# Patient Record
Sex: Female | Born: 1980
Health system: Southern US, Community
[De-identification: ages and names within clinical notes are randomized; demographics above are authoritative.]

## PROBLEM LIST (undated history)

## (undated) DIAGNOSIS — T4145XA Adverse effect of unspecified anesthetic, initial encounter: Secondary | ICD-10-CM

## (undated) DIAGNOSIS — T8859XA Other complications of anesthesia, initial encounter: Secondary | ICD-10-CM

## (undated) DIAGNOSIS — N63 Unspecified lump in unspecified breast: Secondary | ICD-10-CM

## (undated) DIAGNOSIS — G939 Disorder of brain, unspecified: Secondary | ICD-10-CM

## (undated) DIAGNOSIS — M797 Fibromyalgia: Secondary | ICD-10-CM

## (undated) DIAGNOSIS — R000 Tachycardia, unspecified: Secondary | ICD-10-CM

## (undated) DIAGNOSIS — G500 Trigeminal neuralgia: Secondary | ICD-10-CM

## (undated) DIAGNOSIS — E669 Obesity, unspecified: Secondary | ICD-10-CM

## (undated) DIAGNOSIS — F39 Unspecified mood [affective] disorder: Secondary | ICD-10-CM

## (undated) HISTORY — PX: ELBOW FRACTURE SURGERY: SHX616

## (undated) HISTORY — DX: Obesity, unspecified: E66.9

## (undated) HISTORY — PX: NASAL TURBINATE REDUCTION: SHX2072

## (undated) HISTORY — PX: BREAST EXCISIONAL BIOPSY: SUR124

---
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
## 2001-11-04 ENCOUNTER — Emergency Department (HOSPITAL_COMMUNITY): Admission: EM | Admit: 2001-11-04 | Discharge: 2001-11-04 | Payer: Self-pay | Admitting: Emergency Medicine

## 2001-12-28 ENCOUNTER — Other Ambulatory Visit: Admission: RE | Admit: 2001-12-28 | Discharge: 2001-12-28 | Payer: Self-pay | Admitting: *Deleted

## 2002-01-06 ENCOUNTER — Encounter: Payer: Self-pay | Admitting: *Deleted

## 2002-01-06 ENCOUNTER — Ambulatory Visit (HOSPITAL_COMMUNITY): Admission: AD | Admit: 2002-01-06 | Discharge: 2002-01-06 | Payer: Self-pay | Admitting: *Deleted

## 2002-01-06 ENCOUNTER — Encounter (INDEPENDENT_AMBULATORY_CARE_PROVIDER_SITE_OTHER): Payer: Self-pay

## 2002-01-06 HISTORY — PX: DILATION AND CURETTAGE OF UTERUS: SHX78

## 2002-01-11 ENCOUNTER — Inpatient Hospital Stay (HOSPITAL_COMMUNITY): Admission: AD | Admit: 2002-01-11 | Discharge: 2002-01-11 | Payer: Self-pay | Admitting: *Deleted

## 2002-01-18 ENCOUNTER — Inpatient Hospital Stay (HOSPITAL_COMMUNITY): Admission: AD | Admit: 2002-01-18 | Discharge: 2002-01-18 | Payer: Self-pay | Admitting: *Deleted

## 2002-01-18 ENCOUNTER — Encounter (INDEPENDENT_AMBULATORY_CARE_PROVIDER_SITE_OTHER): Payer: Self-pay

## 2002-04-27 ENCOUNTER — Emergency Department (HOSPITAL_COMMUNITY): Admission: EM | Admit: 2002-04-27 | Discharge: 2002-04-27 | Payer: Self-pay | Admitting: Emergency Medicine

## 2002-05-09 ENCOUNTER — Emergency Department (HOSPITAL_COMMUNITY): Admission: EM | Admit: 2002-05-09 | Discharge: 2002-05-09 | Payer: Self-pay | Admitting: Emergency Medicine

## 2003-07-20 ENCOUNTER — Inpatient Hospital Stay (HOSPITAL_COMMUNITY): Admission: AD | Admit: 2003-07-20 | Discharge: 2003-07-21 | Payer: Self-pay | Admitting: Obstetrics

## 2003-12-02 ENCOUNTER — Emergency Department (HOSPITAL_COMMUNITY): Admission: EM | Admit: 2003-12-02 | Discharge: 2003-12-03 | Payer: Self-pay | Admitting: Emergency Medicine

## 2004-01-09 ENCOUNTER — Inpatient Hospital Stay (HOSPITAL_COMMUNITY): Admission: AD | Admit: 2004-01-09 | Discharge: 2004-01-09 | Payer: Self-pay | Admitting: Obstetrics & Gynecology

## 2004-03-01 ENCOUNTER — Inpatient Hospital Stay (HOSPITAL_COMMUNITY): Admission: AD | Admit: 2004-03-01 | Discharge: 2004-03-01 | Payer: Self-pay | Admitting: Obstetrics and Gynecology

## 2004-03-05 ENCOUNTER — Inpatient Hospital Stay (HOSPITAL_COMMUNITY): Admission: AD | Admit: 2004-03-05 | Discharge: 2004-03-05 | Payer: Self-pay | Admitting: Obstetrics and Gynecology

## 2004-03-07 ENCOUNTER — Inpatient Hospital Stay (HOSPITAL_COMMUNITY): Admission: AD | Admit: 2004-03-07 | Discharge: 2004-03-08 | Payer: Self-pay | Admitting: Obstetrics & Gynecology

## 2004-03-08 ENCOUNTER — Inpatient Hospital Stay (HOSPITAL_COMMUNITY): Admission: AD | Admit: 2004-03-08 | Discharge: 2004-03-08 | Payer: Self-pay | Admitting: Obstetrics & Gynecology

## 2004-03-09 ENCOUNTER — Inpatient Hospital Stay (HOSPITAL_COMMUNITY): Admission: AD | Admit: 2004-03-09 | Discharge: 2004-03-12 | Payer: Self-pay | Admitting: Obstetrics & Gynecology

## 2004-08-04 ENCOUNTER — Emergency Department (HOSPITAL_COMMUNITY): Admission: EM | Admit: 2004-08-04 | Discharge: 2004-08-04 | Payer: Self-pay | Admitting: Emergency Medicine

## 2004-08-28 ENCOUNTER — Emergency Department (HOSPITAL_COMMUNITY): Admission: EM | Admit: 2004-08-28 | Discharge: 2004-08-28 | Payer: Self-pay | Admitting: Emergency Medicine

## 2004-11-11 ENCOUNTER — Emergency Department (HOSPITAL_COMMUNITY): Admission: EM | Admit: 2004-11-11 | Discharge: 2004-11-11 | Payer: Self-pay | Admitting: Emergency Medicine

## 2005-02-14 ENCOUNTER — Inpatient Hospital Stay (HOSPITAL_COMMUNITY): Admission: AD | Admit: 2005-02-14 | Discharge: 2005-02-14 | Payer: Self-pay | Admitting: Obstetrics & Gynecology

## 2005-02-19 ENCOUNTER — Inpatient Hospital Stay (HOSPITAL_COMMUNITY): Admission: AD | Admit: 2005-02-19 | Discharge: 2005-02-19 | Payer: Self-pay | Admitting: Obstetrics & Gynecology

## 2005-03-12 ENCOUNTER — Other Ambulatory Visit: Admission: RE | Admit: 2005-03-12 | Discharge: 2005-03-12 | Payer: Self-pay | Admitting: Obstetrics and Gynecology

## 2005-05-17 ENCOUNTER — Inpatient Hospital Stay (HOSPITAL_COMMUNITY): Admission: AD | Admit: 2005-05-17 | Discharge: 2005-05-17 | Payer: Self-pay | Admitting: Obstetrics and Gynecology

## 2005-07-18 ENCOUNTER — Inpatient Hospital Stay (HOSPITAL_COMMUNITY): Admission: AD | Admit: 2005-07-18 | Discharge: 2005-07-19 | Payer: Self-pay | Admitting: Obstetrics and Gynecology

## 2005-07-24 ENCOUNTER — Emergency Department (HOSPITAL_COMMUNITY): Admission: EM | Admit: 2005-07-24 | Discharge: 2005-07-24 | Payer: Self-pay | Admitting: Emergency Medicine

## 2005-09-18 ENCOUNTER — Inpatient Hospital Stay (HOSPITAL_COMMUNITY): Admission: AD | Admit: 2005-09-18 | Discharge: 2005-09-18 | Payer: Self-pay | Admitting: Obstetrics and Gynecology

## 2005-09-20 ENCOUNTER — Inpatient Hospital Stay (HOSPITAL_COMMUNITY): Admission: AD | Admit: 2005-09-20 | Discharge: 2005-09-20 | Payer: Self-pay | Admitting: Obstetrics and Gynecology

## 2005-09-30 ENCOUNTER — Ambulatory Visit (HOSPITAL_COMMUNITY): Admission: RE | Admit: 2005-09-30 | Discharge: 2005-09-30 | Payer: Self-pay | Admitting: Obstetrics and Gynecology

## 2005-10-07 ENCOUNTER — Inpatient Hospital Stay (HOSPITAL_COMMUNITY): Admission: AD | Admit: 2005-10-07 | Discharge: 2005-10-07 | Payer: Self-pay | Admitting: Obstetrics and Gynecology

## 2005-10-07 ENCOUNTER — Inpatient Hospital Stay (HOSPITAL_COMMUNITY): Admission: AD | Admit: 2005-10-07 | Discharge: 2005-10-10 | Payer: Self-pay | Admitting: Obstetrics and Gynecology

## 2005-10-12 ENCOUNTER — Ambulatory Visit: Admission: RE | Admit: 2005-10-12 | Discharge: 2005-10-12 | Payer: Self-pay | Admitting: Obstetrics and Gynecology

## 2006-07-02 ENCOUNTER — Other Ambulatory Visit: Admission: RE | Admit: 2006-07-02 | Discharge: 2006-07-02 | Payer: Self-pay | Admitting: Family Medicine

## 2007-05-23 ENCOUNTER — Inpatient Hospital Stay (HOSPITAL_COMMUNITY): Admission: AD | Admit: 2007-05-23 | Discharge: 2007-05-23 | Payer: Self-pay | Admitting: Obstetrics and Gynecology

## 2007-05-30 ENCOUNTER — Inpatient Hospital Stay (HOSPITAL_COMMUNITY): Admission: AD | Admit: 2007-05-30 | Discharge: 2007-05-31 | Payer: Self-pay | Admitting: Obstetrics and Gynecology

## 2007-07-31 ENCOUNTER — Emergency Department (HOSPITAL_COMMUNITY): Admission: EM | Admit: 2007-07-31 | Discharge: 2007-07-31 | Payer: Self-pay | Admitting: Emergency Medicine

## 2007-09-06 ENCOUNTER — Ambulatory Visit (HOSPITAL_COMMUNITY): Admission: RE | Admit: 2007-09-06 | Discharge: 2007-09-06 | Payer: Self-pay | Admitting: Obstetrics and Gynecology

## 2007-11-23 ENCOUNTER — Inpatient Hospital Stay (HOSPITAL_COMMUNITY): Admission: AD | Admit: 2007-11-23 | Discharge: 2007-11-23 | Payer: Self-pay | Admitting: Obstetrics and Gynecology

## 2007-12-07 ENCOUNTER — Inpatient Hospital Stay (HOSPITAL_COMMUNITY): Admission: AD | Admit: 2007-12-07 | Discharge: 2007-12-07 | Payer: Self-pay | Admitting: Obstetrics and Gynecology

## 2007-12-20 ENCOUNTER — Inpatient Hospital Stay (HOSPITAL_COMMUNITY): Admission: RE | Admit: 2007-12-20 | Discharge: 2007-12-20 | Payer: Self-pay | Admitting: Obstetrics and Gynecology

## 2007-12-22 ENCOUNTER — Inpatient Hospital Stay (HOSPITAL_COMMUNITY): Admission: RE | Admit: 2007-12-22 | Discharge: 2007-12-24 | Payer: Self-pay | Admitting: Obstetrics and Gynecology

## 2008-05-04 HISTORY — PX: UMBILICAL HERNIA REPAIR: SHX196

## 2010-03-09 ENCOUNTER — Emergency Department (HOSPITAL_COMMUNITY): Admission: EM | Admit: 2010-03-09 | Discharge: 2010-03-09 | Payer: Self-pay | Admitting: Emergency Medicine

## 2010-03-17 ENCOUNTER — Emergency Department (HOSPITAL_BASED_OUTPATIENT_CLINIC_OR_DEPARTMENT_OTHER): Admission: EM | Admit: 2010-03-17 | Discharge: 2010-03-17 | Payer: Self-pay | Admitting: Emergency Medicine

## 2010-03-17 ENCOUNTER — Ambulatory Visit: Payer: Self-pay | Admitting: Diagnostic Radiology

## 2010-07-15 LAB — COMPREHENSIVE METABOLIC PANEL
ALT: 17 U/L (ref 0–35)
AST: 16 U/L (ref 0–37)
Albumin: 4.3 g/dL (ref 3.5–5.2)
Alkaline Phosphatase: 85 U/L (ref 39–117)
BUN: 14 mg/dL (ref 6–23)
CO2: 26 mEq/L (ref 19–32)
Calcium: 9 mg/dL (ref 8.4–10.5)
Chloride: 107 mEq/L (ref 96–112)
Creatinine, Ser: 0.8 mg/dL (ref 0.4–1.2)
GFR calc Af Amer: >60 mL/min (ref >60)
GFR calc non Af Amer: >60 mL/min (ref >60)
Glucose, Bld: 86 mg/dL (ref 70–99)
Potassium: 4.2 mEq/L (ref 3.5–5.1)
Sodium: 145 mEq/L (ref 135–145)
Total Bilirubin: 0.7 mg/dL (ref 0.3–1.2)
Total Protein: 7.3 g/dL (ref 6.0–8.3)

## 2010-07-15 LAB — GC/CHLAMYDIA PROBE AMP, GENITAL
Chlamydia, DNA Probe: NEGATIVE
GC Probe Amp, Genital: NEGATIVE

## 2010-07-15 LAB — DIFFERENTIAL
Basophils Absolute: 0 10*3/uL (ref 0.0–0.1)
Basophils Relative: 1 % (ref 0–1)
Eosinophils Absolute: 0.3 10*3/uL (ref 0.0–0.7)
Eosinophils Relative: 3 % (ref 0–5)
Lymphocytes Relative: 23 % (ref 12–46)
Lymphs Abs: 2.1 10*3/uL (ref 0.7–4.0)
Monocytes Absolute: 0.6 10*3/uL (ref 0.1–1.0)
Monocytes Relative: 7 % (ref 3–12)
Neutro Abs: 5.9 10*3/uL (ref 1.7–7.7)
Neutrophils Relative %: 66 % (ref 43–77)

## 2010-07-15 LAB — WET PREP, GENITAL
Trich, Wet Prep: NONE SEEN
Yeast Wet Prep HPF POC: NONE SEEN

## 2010-07-15 LAB — POCT URINALYSIS DIPSTICK
Bilirubin Urine: NEGATIVE
Glucose, UA: NEGATIVE mg/dL
Hgb urine dipstick: NEGATIVE
Ketones, ur: NEGATIVE mg/dL
Nitrite: NEGATIVE
Protein, ur: NEGATIVE mg/dL
Specific Gravity, Urine: 1.02 (ref 1.005–1.030)
Urobilinogen, UA: 1 mg/dL (ref 0.0–1.0)
pH: 6.5 (ref 5.0–8.0)

## 2010-07-15 LAB — PREGNANCY, URINE: Preg Test, Ur: NEGATIVE

## 2010-07-15 LAB — URINALYSIS, ROUTINE W REFLEX MICROSCOPIC
Bilirubin Urine: NEGATIVE
Glucose, UA: NEGATIVE mg/dL
Hgb urine dipstick: NEGATIVE
Ketones, ur: NEGATIVE mg/dL
Nitrite: NEGATIVE
Protein, ur: NEGATIVE mg/dL
Specific Gravity, Urine: 1.022 (ref 1.005–1.030)
Urobilinogen, UA: 1 mg/dL (ref 0.0–1.0)
pH: 8 (ref 5.0–8.0)

## 2010-07-15 LAB — CBC
HCT: 38.4 % (ref 36.0–46.0)
Hemoglobin: 13.5 g/dL (ref 12.0–15.0)
MCH: 31.4 pg (ref 26.0–34.0)
MCHC: 35.1 g/dL (ref 30.0–36.0)
MCV: 89.3 fL (ref 78.0–100.0)
Platelets: 239 10*3/uL (ref 150–400)
RBC: 4.3 MIL/uL (ref 3.87–5.11)
RDW: 11.5 % (ref 11.5–15.5)
WBC: 8.9 10*3/uL (ref 4.0–10.5)

## 2010-07-15 LAB — LIPASE, BLOOD: Lipase: 85 U/L (ref 23–300)

## 2010-07-15 LAB — POCT PREGNANCY, URINE: Preg Test, Ur: NEGATIVE

## 2010-07-31 ENCOUNTER — Encounter (HOSPITAL_COMMUNITY)
Admission: RE | Admit: 2010-07-31 | Discharge: 2010-07-31 | Disposition: A | Discharge status: Home / Self Care | Payer: Commercial Managed Care - PPO | Source: Ambulatory Visit | Attending: General Surgery | Admitting: General Surgery

## 2010-07-31 LAB — CBC
HCT: 41.4 % (ref 36.0–46.0)
Hemoglobin: 14.2 g/dL (ref 12.0–15.0)
MCH: 30.7 pg (ref 26.0–34.0)
MCHC: 34.3 g/dL (ref 30.0–36.0)
MCV: 89.4 fL (ref 78.0–100.0)
Platelets: 264 10*3/uL (ref 150–400)
RBC: 4.63 MIL/uL (ref 3.87–5.11)

## 2010-07-31 LAB — COMPREHENSIVE METABOLIC PANEL
ALT: 21 U/L (ref 0–35)
AST: 17 U/L (ref 0–37)
Albumin: 4.4 g/dL (ref 3.5–5.2)
Alkaline Phosphatase: 72 U/L (ref 39–117)
BUN: 8 mg/dL (ref 6–23)
CO2: 29 mEq/L (ref 19–32)
Calcium: 9.4 mg/dL (ref 8.4–10.5)
Chloride: 101 mEq/L (ref 96–112)
GFR calc Af Amer: >60 mL/min (ref >60)
GFR calc non Af Amer: >60 mL/min (ref >60)
Glucose, Bld: 90 mg/dL (ref 70–99)
Potassium: 4.7 mEq/L (ref 3.5–5.1)
Total Bilirubin: 0.9 mg/dL (ref 0.3–1.2)

## 2010-07-31 LAB — HCG, SERUM, QUALITATIVE: Preg, Serum: NEGATIVE

## 2010-08-01 ENCOUNTER — Observation Stay (HOSPITAL_COMMUNITY)
Admission: RE | Admit: 2010-08-01 | Discharge: 2010-08-02 | Disposition: A | Discharge status: Home / Self Care | Payer: Commercial Managed Care - PPO | Source: Ambulatory Visit | Attending: General Surgery | Admitting: General Surgery

## 2010-08-01 ENCOUNTER — Other Ambulatory Visit: Payer: Self-pay | Admitting: General Surgery

## 2010-08-01 DIAGNOSIS — F3289 Other specified depressive episodes: Secondary | ICD-10-CM | POA: Insufficient documentation

## 2010-08-01 DIAGNOSIS — F329 Major depressive disorder, single episode, unspecified: Secondary | ICD-10-CM | POA: Insufficient documentation

## 2010-08-01 DIAGNOSIS — F411 Generalized anxiety disorder: Secondary | ICD-10-CM | POA: Insufficient documentation

## 2010-08-01 DIAGNOSIS — K828 Other specified diseases of gallbladder: Principal | ICD-10-CM | POA: Insufficient documentation

## 2010-08-01 DIAGNOSIS — Z01812 Encounter for preprocedural laboratory examination: Secondary | ICD-10-CM | POA: Insufficient documentation

## 2010-08-01 DIAGNOSIS — E669 Obesity, unspecified: Secondary | ICD-10-CM | POA: Insufficient documentation

## 2010-08-01 HISTORY — PX: CHOLECYSTECTOMY: SHX55

## 2010-08-07 NOTE — Op Note (Signed)
Debra Hensley, SEEFELD              ACCOUNT NO.:  [id number]  MEDICAL RECORD NO.:  [id number]           PATIENT TYPE:  O  LOCATION:  5120                         FACILITY:  [facility]  PHYSICIAN:  Debra Hensley. Debra Campanile, MD     DATE OF BIRTH:  09-20-1980  DATE OF PROCEDURE:  08/01/2010 DATE OF DISCHARGE:                              OPERATIVE REPORT   PREOPERATIVE DIAGNOSIS:  Biliary dyskinesia.  POSTOPERATIVE DIAGNOSIS:  Biliary dyskinesia.  PROCEDURE:  Laparoscopic cholecystectomy.  SURGEON:  Debra Hensley. Debra Campanile, MD  ASSISTANT SURGEON:  None.  ANESTHESIA:  General plus 30 mL of 0.25% Marcaine with epi.  FINDINGS:  A critical view was achieved.  She did have some omental adhesions at her umbilicus from her prior umbilical hernia repair by Dr. Freida Busman in 2010.  ESTIMATED BLOOD LOSS:  Minimal.  INDICATIONS FOR PROCEDURE:  The patient is a very pleasant 30 year old female who developed intermittent abdominal pain on and off for the past year mainly on her right side radiating to her back.  She would gently develop one episode a month and discomfort would last at least 1 day. It was always postprandial after eating anything spicy, greasy or fried. However, this past weekend, she developed constant pain in the right upper quadrant.  She tried several different remedies, all without relief.  She went to her primary care doctor's office yesterday where labs were checked which revealed a normal white blood cell count, normal LFTs.  She was sent to our office for worsening of gallbladder issues. We discussed the risks and benefits of surgery including bleeding, infection, injury to surrounding structures, bile leak, prolonged diarrhea, need to convert to an open procedure, DVT occurrence, umbilical hernia recurrence, injury to common bile duct major reconstructive bile duct surgery.  She elected to proceed with surgery.  DESCRIPTION OF PROCEDURE:  After obtaining informed consent, the patient was  brought to the operating room and placed supine on the operating table.  Sequential compression devices were placed.  A surgical time-out was performed.  She received antibiotics prior to skin incision.  Her abdomen was prepped and draped in the usual standard surgical fashion. She had a transverse infraumbilical incision from her previous umbilical hernia repair.  Local was infiltrated at this site.  I then incised the old incision with a #11 blade.  The subcutaneous tissue was spread.  The fascia was grasped with Kochers and lifted anteriorly.  Next, the fascia was incised with a #11 blade.  A pursestring suture was placed around the fascial edges and the Hasson trocar was introduced. Pneumoperitoneum was smoothly established up to a patient pressure of 15 mmHg and the laparoscope was advanced.  There was some obvious thin filmy omental adhesions in this area.  I was able to navigate the scope around this into her upper abdomen.  We then placed her in reverse Trendelenburg and rotated her to the left.  Three additional 5-mm trocars were placed, one in the subxiphoid and two in the right hypochondrium, all under direct visualization after local had been infiltrated.  I then placed the camera scope in the subxiphoid trocar and looked at  the umbilical area.  There was no bowel that was adhered in this area.  It was simply omentum which was mainly tethered to her abdominal wall at the level of her previous hernia repair, this was a focal area.  I was able to return the scope back to the abdomen and visualize the right upper quadrant.  We grasped the gallbladder through the most laterally placed right-sided abdominal trocar and retracted to the right shoulder.  The neck of the gallbladder was grasped and retracted laterally.  She had one thin filmy omental adhesion at the neck which was gently stripped down.  I then used hook electrocautery to incise the gallbladder and peritoneum, both  medially and laterally.  The cystic duct as well as the cystic artery were each circumferentially dissected around with aid of a [place].  I achieved critical view.  The saw the liver posteriorly.  There were no other structures entering the gallbladder.  Three clips were placed in the downside of the cystic duct, one clip on the cystic duct distally.  Two clips were placed on the downside of the cystic artery and one distally.  It was then transected with Endoshears and the cystic duct was transected with Endoshears.  The gallbladder was then rolled up our to the gallbladder fossa using hook electrocautery until it was eventually freed. Hemostasis had been achieved from the gallbladder fossa.  Because I had to use three 5-mm trocars, I wanted to bring the gallbladder out through the umbilical area.  In order to do this, I needed to lyse those adhesions.  The camera was switched to the subxiphoid trocar.  Then using Endoshears with cautery, I lysed see-through mental attachments from the anterior abdominal wall.  There was a little bit of oozing from two particular spots in the omentum.  Using [place] with electrocautery, hemostasis was achieved.  This freed the Hasson trocar in its entirety.  The Endobag was advanced through the umbilical trocar. The gallbladder was placed in the specimen bag.  It was then extracted from the abdominal cavity.  Pneumoperitoneum was reestablished with replacement of the Hasson trocar.  I reinspected the omentum.  I evacuated some of the clot.  There was no evidence of any ongoing bleeding.  There was no evidence of bowel or bowel injury.  I then lifted up the liver edge and inspected the gallbladder fossa.  There was no evidence of bile leak.  There was no evidence of bleeding.  I briefly irrigated the right upper quadrant.  The irrigation fluid was evacuated. I reinspected the omentum again.  There was no evidence of bleeding.  I removed the  Hasson trocar and tied down the previously placed pursestring suture.  The fascial defect that I had created was obliterated, but because she had had a previous umbilical hernia repair at that spot, I elected to place another interrupted 0 Vicryl suture using suture passer.  The laparoscope was removed.  The pneumoperitoneum was released.  The skin incisions were closed with 4-0 Monocryl in subcuticular fashion followed by application of Dermabond.  The patient was extubated and brought to the recovery in stable condition.  All needle, instrument, and sponge counts were corrected x2.  There were no immediate complications.  The patient tolerated the procedure well.     Debra Hensley. Debra Campanile, MD     EMW/MEDQ  D:  08/01/2010  T:  08/02/2010  Job:  [id number]  cc:   Ricci Barker, PA  Electronically Signed by Gaynelle Adu  M.D. on 08/07/2010 07:51:43 AM

## 2010-08-26 NOTE — Discharge Summary (Signed)
  Debra Hensley, Debra Hensley              ACCOUNT NO.:  [id number]  MEDICAL RECORD NO.:  [id number]           PATIENT TYPE:  O  LOCATION:  5120                         FACILITY:  [facility]  PHYSICIAN:  [name], MD     DATE OF BIRTH:  15-May-1980  DATE OF ADMISSION:  08/01/2010 DATE OF DISCHARGE:  08/02/2010                              DISCHARGE SUMMARY   DISCHARGING PHYSICIAN:  Currie Paris, MD  ADMITTING DIAGNOSES: 1. Obesity. 2. Depression. 3. Anxiety. 4. Biliary dyskinesia.  DISCHARGE DIAGNOSES: 1. Obesity. 2. Depression. 3. Anxiety. 4. Biliary dyskinesia.  PROCEDURES DURING HOSPITALIZATION:  Laparoscopic cholecystectomy on August 01, 2010.  HOSPITAL COURSE:  The patient was admitted and taken to the operating room on August 01, 2010.  She underwent a laparoscopic cholecystectomy. Please see operative note for further details.  Her postoperative course was unremarkable.  On postoperative day #1, she was deemed stable fordischarge.  She was tolerating liquid diet.  Her pain was well controlled.  Her incisions were clean, dry, and intact.  She was deemed stable for discharge.  DISCHARGE MEDICATIONS:  She is going to continue her home dose citalopram 20 mg a day.  She was given prescription of Vicodin 5/325 one to two tabs every 4 hours as needed for pain.  DISCHARGE PLAN:  She is going home.  She is going to follow up with me in the office in 2-3 weeks for followup.  She was instructed to avoid fried greasy foods for several weeks.  She was instructed not to drive or take narcotics or to lift anything heavy for 2 weeks.  She was instructed she may shower on the day of discharge.  She was instructed to call our office if she had worsening pain, fever greater than 101.5, persistent nausea, vomiting, any signs of wound infection or any questions or concerns.     [name], MD     EMW/MEDQ  D:  08/12/2010  T:  08/13/2010  Job:  [id number]  cc:   Ricci Barker, PA  Electronically Signed by Gaynelle Adu M.D. on 08/26/2010 08:03:20 AM

## 2010-09-16 NOTE — Discharge Summary (Signed)
Debra Hensley, Debra Hensley              ACCOUNT NO.:  [id number]   MEDICAL RECORD NO.:  [id number]          PATIENT TYPE:  INP   LOCATION:  9135                          [facility]  [facility]   PHYSICIAN:  Zenaida Niece, M.D.DATE OF BIRTH:  1980/07/30   DATE OF ADMISSION:  12/22/2007  DATE OF DISCHARGE:  12/24/2007                               DISCHARGE SUMMARY   ADMISSION DIAGNOSES:  Intrauterine pregnancy at 37 plus weeks with large-  for-gestational-age infant and polyhydramnios and anxiety.   DISCHARGE DIAGNOSES:  Intrauterine pregnancy at 37 plus weeks with large-  for-gestational-age infant and polyhydramnios and anxiety.   PROCEDURE:  On December 22, 2007, she had a spontaneous vaginal delivery.   HISTORY AND PHYSICAL:  This is a 30 year old female gravida 4, para 2-0-  1-2 with an EGA of 37 plus weeks who presents for induction due to an  LGA infant and polyhydramnios.  The patient had an amniocentesis on  December 20, 2007, which documented fetal lung maturity.  Cervix was  favorable at 2 cm.  Prenatal care complicated by problems with shortness  of breath and evaluated by Cardiology with normal echo.  It was felt  that this was related to anxiety and she was started on Zoloft, which  was increased to 100 mg secondary to depression and then backed off to  50 mg.  She has also had some preterm contractions.   PRENATAL LABORATORY DATA:  Blood type is B positive with negative  antibody screen, RPR nonreactive, rubella immune, hepatitis B surface  antigen negative, HIV negative, Gonorrhea and Chlamydia negative, group  B strep is negative, and 1-hour glucola is 124.   PAST OBSTETRICAL HISTORY:  Two vaginal deliveries at term 8 pounds 9  ounces and 8 pounds 4 ounces and one spontaneous abortion.   GYNECOLOGICAL HISTORY:  History of abnormal Pap smear and HPV.   PAST MEDICAL HISTORY:  1. Anxiety and depression.  2. UTIs.   PAST SURGICAL HISTORY:  D&C.   MEDICATIONS:  Zoloft,  Zantac, and prenatal vitamins.   PHYSICAL EXAMINATION:  VITAL SIGNS:  She is afebrile with stable vital  signs.  Fetal heart tracing is reassuring.  ABDOMEN:  Gravid and nontender.  GENITOURINARY:  Cervix is 2, 50 minus 2.  Dr. Senaida Ores was able to  rupture membranes with fetal scalp electrode and fluid was slightly  bloody.   HOSPITAL COURSE:  The patient was admitted and started on Pitocin.  Dr.  Senaida Ores then ruptured membranes with fetal scalp electrode.  She  progressed into labor and had an IUPC placed.  She progressed to  complete, pushed well, and on the evening of December 22, 2007, had a  vaginal delivery of a viable female infant with Apgars of 8 and 9 weighed  9 pounds 6 ounces.  Placenta delivered spontaneous and was intact.  The  uterus contracted well and perineum was intact.  Estimated blood loss  was 350 mL.  Postpartum, she had no significant complications.  Pre-  delivery hemoglobin 10.8 and postdelivery 9.7.  On postpartum #2, she  was felt to be stable enough for discharge  home.   DISCHARGE INSTRUCTIONS:  Regular diet.  Pelvic rest.  Follow up in 6  weeks.  Medications are Percocet #20 one-two p.o. q.4-6 h. p.r.n. pain  and over-the-counter ibuprofen as needed, and she is given our discharge  pamphlet.      Zenaida Niece, M.D.  Electronically Signed     TDM/MEDQ  D:  12/24/2007  T:  12/25/2007  Job:  [id number]

## 2010-09-19 NOTE — Op Note (Signed)
   NAME:  Debra Hensley, Debra Hensley                         ACCOUNT NO.:  [id number]   MEDICAL RECORD NO.:  [id number]                   PATIENT TYPE:  MAT   LOCATION:  MATC                                 [facility]ACILITY:  [facility]   PHYSICIAN:  Rudy Jew. Ciclaly Hensley, M.D.             DATE OF BIRTH:  13-Apr-1981   DATE OF PROCEDURE:  01/06/2002  DATE OF DISCHARGE:                                 OPERATIVE REPORT   PREOPERATIVE DIAGNOSIS:  Inevitable abortion.   POSTOPERATIVE DIAGNOSIS:  Inevitable abortion.   PROCEDURE:  Suction dilatation and curettage.   SURGEON:  Rudy Jew. Aniyah Hensley, M.D.   ANESTHESIA:  Monitored anesthesia care with paracervical block (1% Xylocaine  - 15 cc).   FINDINGS:  The uterus sounded to approximately 8 cm.  A moderate amount of  apparent products of conception were obtained.   ESTIMATED BLOOD LOSS:  50 cc.   COMPLICATIONS:  None.   PACKS AND DRAINS:  None.   TYPE AND RH:  B-positive.   PROCEDURE:  The patient was taken to the operating room and placed in the  dorsal supine position.  After adequate IV sedation was administered, she  was placed in the lithotomy position and prepped and draped in the usual  manner for vaginal surgery.  Posterior weighted speculum was placed per  vagina.  The anterior lip of the cervix was grasped with a single-tooth  tenaculum.  The uterus was gently sounded to approximately 8 cm.  The cervix  was then serially dilated to a size 25 [place] using [employer] dilators.  An 8-mm  suction curette was then introduced into the uterine cavity and suction  applied.  A moderate amount of apparent products of conception was delivered  through the tubing.  After several passes with the suction curette no  additional tissue was obtained.  The vaginal instruments were removed.  Hemostasis was noted and the procedure terminated.  The patient tolerated  the procedure extremely well and was returned to the recovery room in good  condition.                                           Debra Hensley, M.D.    JAM/MEDQ  D:  01/06/2002  T:  01/06/2002  Job:  [id number]

## 2010-09-19 NOTE — H&P (Signed)
Debra Hensley, Debra Hensley              ACCOUNT NO.:  [id number]   MEDICAL RECORD NO.:  [id number]           PATIENT TYPE:   LOCATION:                                 FACILITY:   PHYSICIAN:  Charles A. Sydnee Cabal, MD    DATE OF BIRTH:   DATE OF ADMISSION:  10/11/2005  DATE OF DISCHARGE:                                HISTORY & PHYSICAL   DATE OF ADMISSION:  October 11, 2005 at 41 weeks, [facility] October 04, 2005.  Admitted  for post dates induction.   She is a gravida 3, para 1-0-1-1.  Pregnancy complicated by anxiety, panic  disorder and anemia.  Hemoglobin 11.0 at 28 weeks and the patient on Xanax  0.5 p.r.n. and Zoloft 15 mg per day during the pregnancy.  Other labs:  B  positive, antibody screen negative.  VDRL nonreactive.  Rubella immune.  Hepatitis B surface antigen negative.  HIV declined.  GC and chlamydia  negative.  Pap negative.  Cystic fibrosis declined.  One-hour Glucola 108.  Quad screen declined.  Hemoglobin, as noted, 11.0 at 28 weeks.  Group B  strep negative at 36 weeks.  Ultrasound general screening exam at 17 weeks  normal.   PAST MEDICAL HISTORY:  None.   SURGICAL HISTORY:  D&C for miscarriage and broken elbow.   MEDICATIONS:  Prenatal vitamins, Xanax and Zoloft.   ALLERGIES:  No known drug allergies.   SOCIAL HISTORY:  No tobacco, ethanol, or drug use or STD exposure in the  past.  She is married in a monogamous relationship with her husband.   REVIEW OF SYSTEMS:  No fever, chills, nausea, vomiting, rashes, lesions,  headache, dizziness, chest pain, shortness of breath, wheezing, bleeding per  rectum or melena, urgency, frequency, dysuria, hematuria, galactorrhea, or  emotional changes currently.   PHYSICAL EXAMINATION:  GENERAL:  Alert and oriented x3, no distress. Blood  pressure 128/78, respirations 18, pulse 98.  The patient is afebrile.  HEENT:  Grossly within normal limits.  NECK:  Supple without thyromegaly or adenopathy.  LUNGS:  Clear bilaterally.  BACK:  No  CVAT.  BREASTS:  No masses, tenderness, discharge, skin, or nipple changes  bilaterally.  HEART:  Regular rate and rhythm, 2/6 systolic ejection murmur left sternal  border.  ABDOMEN:  Soft, gravid, fundal height 38 cm.  Fetal heart rate 150.  Vertex  on Leopold's exam.  PELVIC:  Cervix posterior, soft, 1-2 cm dilated, vertex, intact.  EXTREMITIES:  Mild edema bilaterally, symmetrical.   ASSESSMENT:  Intrauterine pregnancy, to be 41 weeks at time of delivery.   PLAN:  Pitocin induction.  Note that she had postpartum hemorrhage,  endometritis last pregnancy.  Will be ready with Methergine and/or Hemabate  and/or Cytotec with likely prophylactic Cytotec per rectum this pregnancy as  needed.  The patient will be admitted as noted above and is in agreement  with this care plan.      Charles A. Sydnee Cabal, MD  Electronically Signed     CAD/MEDQ  D:  10/06/2005  T:  10/06/2005  Job:  [id number]

## 2010-09-19 NOTE — Op Note (Signed)
Debra Hensley, Debra Hensley              ACCOUNT NO.:  [id number]   MEDICAL RECORD NO.:  [id number]          PATIENT TYPE:  INP   LOCATION:  9110                          [facility]ILITY[facility]   PHYSICIAN:  Charles A. Delcambre, MDDATE OF BIRTH:  07/26/1980   DATE OF PROCEDURE:  10/08/2005  DATE OF DISCHARGE:                                 OPERATIVE REPORT   DELIVERY NOTE:  This patient was admitted.  Please see dictated H&P.  She  was noted to be in active labor, 3 cm dilated, changed from 1 cm earlier in  the day.  Had OB check at that time and 90% effaced and -1 station per RN in  MAU.  She was admitted, progressed on through active labor and was given an  epidural at her choice and requested.  She had reactive fetal heart rate and  was given an epidural and which was effective.  She progressed onto become 8  cm dilated.  I was called to see the patient as she had a bulging bag of  water.  Upon my examination, she was completed dilated and +1 station.  Artificial rupture of membranes was done.  Clear amniotic fluid was noted.  She was completely dilated at that time, 0203.  She had spontaneous vaginal  delivery at 0227.  Placenta followed spontaneously at 5.  She had  vigorous female, Apgars 8 and 8.  Placenta was spontaneous, three-vessel and  intact.  There were no clots passed before the placenta and after the  placenta responding to removal of the clots and vigorous massage.  Cytotec  800 mcg was given per rectum and 40 units of Pitocin was placed in 800 mL of  IV fluid, lactated Ringer's.  Bleeding responded to these measures and  observation yielded adequate slowing of the bleeding.  The intact perineum  was noted and no further intervention was required beyond the continued  Pitocin and the IV fluid.  Father of the baby cut the cord.  Cord gas was  taken but was inadequate amount of blood according to lab personnel and as  placenta had delivered, there was no opportunity to redraw.   Mother and baby  were recovering stably at this time.  Estimated blood loss was 600 mL.      Charles A. Sydnee Cabal, MD  Electronically Signed     CAD/MEDQ  D:  10/08/2005  T:  10/08/2005  Job:  [id number]

## 2010-10-01 ENCOUNTER — Encounter (INDEPENDENT_AMBULATORY_CARE_PROVIDER_SITE_OTHER): Payer: Self-pay | Admitting: General Surgery

## 2010-11-03 ENCOUNTER — Other Ambulatory Visit: Payer: Self-pay | Admitting: Family Medicine

## 2010-11-03 ENCOUNTER — Other Ambulatory Visit (HOSPITAL_COMMUNITY)
Admission: RE | Admit: 2010-11-03 | Discharge: 2010-11-03 | Disposition: A | Discharge status: Home / Self Care | Payer: Commercial Managed Care - PPO | Source: Ambulatory Visit | Attending: Family Medicine | Admitting: Family Medicine

## 2010-11-03 DIAGNOSIS — Z01419 Encounter for gynecological examination (general) (routine) without abnormal findings: Secondary | ICD-10-CM | POA: Insufficient documentation

## 2010-11-15 ENCOUNTER — Emergency Department (HOSPITAL_BASED_OUTPATIENT_CLINIC_OR_DEPARTMENT_OTHER)
Admission: EM | Admit: 2010-11-15 | Discharge: 2010-11-15 | Disposition: A | Discharge status: Home / Self Care | Payer: Commercial Managed Care - PPO | Attending: Emergency Medicine | Admitting: Emergency Medicine

## 2010-11-15 ENCOUNTER — Encounter (HOSPITAL_BASED_OUTPATIENT_CLINIC_OR_DEPARTMENT_OTHER): Payer: Self-pay | Admitting: Emergency Medicine

## 2010-11-15 DIAGNOSIS — J019 Acute sinusitis, unspecified: Secondary | ICD-10-CM

## 2010-11-15 DIAGNOSIS — IMO0001 Reserved for inherently not codable concepts without codable children: Secondary | ICD-10-CM | POA: Insufficient documentation

## 2010-11-15 DIAGNOSIS — F341 Dysthymic disorder: Secondary | ICD-10-CM | POA: Insufficient documentation

## 2010-11-15 DIAGNOSIS — E669 Obesity, unspecified: Secondary | ICD-10-CM | POA: Insufficient documentation

## 2010-11-15 HISTORY — DX: Fibromyalgia: M79.7

## 2010-11-15 MED ORDER — DIPHENHYDRAMINE HCL 50 MG PO CAPS
50.0000 mg | ORAL_CAPSULE | Freq: Once | ORAL | Status: AC
  Administered 2010-11-15: 50 mg via ORAL
  Filled 2010-11-15: qty 1

## 2010-11-15 MED ORDER — ONDANSETRON 8 MG PO TBDP
8.0000 mg | ORAL_TABLET | Freq: Once | ORAL | Status: AC
  Administered 2010-11-15: 8 mg via ORAL

## 2010-11-15 MED ORDER — AZITHROMYCIN 250 MG PO TABS: ORAL_TABLET | ORAL | Status: DC

## 2010-11-15 MED ORDER — TRIAMCINOLONE ACETONIDE(NASAL) 55 MCG/ACT NA INHA: 2.0000 | Freq: Every day | NASAL | Status: DC

## 2010-11-15 MED ORDER — METOCLOPRAMIDE HCL 5 MG/ML IJ SOLN
INTRAMUSCULAR | Status: AC
  Administered 2010-11-15: 10 mg via INTRAMUSCULAR
  Filled 2010-11-15: qty 2

## 2010-11-15 MED ORDER — METOCLOPRAMIDE HCL 5 MG/ML IJ SOLN
10.0000 mg | Freq: Once | INTRAMUSCULAR | Status: AC
  Administered 2010-11-15: 10 mg via INTRAMUSCULAR

## 2010-11-15 MED ORDER — DEXAMETHASONE SODIUM PHOSPHATE 10 MG/ML IJ SOLN
INTRAMUSCULAR | Status: AC
  Administered 2010-11-15: 10 mg via INTRAMUSCULAR
  Filled 2010-11-15: qty 1

## 2010-11-15 MED ORDER — ONDANSETRON 8 MG PO TBDP
ORAL_TABLET | ORAL | Status: AC
  Administered 2010-11-15: 8 mg via ORAL
  Filled 2010-11-15: qty 1

## 2010-11-15 MED ORDER — DIPHENHYDRAMINE HCL 25 MG PO CAPS
ORAL_CAPSULE | ORAL | Status: AC
  Administered 2010-11-15: 50 mg via ORAL
  Filled 2010-11-15: qty 2

## 2010-11-15 MED ORDER — DEXAMETHASONE SODIUM PHOSPHATE 10 MG/ML IJ SOLN
10.0000 mg | Freq: Once | INTRAMUSCULAR | Status: AC
  Administered 2010-11-15: 10 mg via INTRAMUSCULAR

## 2010-11-15 NOTE — ED Provider Notes (Addendum)
History     Chief Complaint  Patient presents with  . Migraine   Patient is a 30 y.o. female presenting with headaches. The history is provided by the patient.  Headache  This is a new problem. The problem occurs constantly. The problem has not changed since onset.The pain is located in the bilateral region. The quality of the pain is described as sharp. The pain is moderate. The pain does not radiate. Associated symptoms include nausea. Pertinent negatives include no fever and no vomiting.  She feels it is related to her sinuses as it is located in bilateral facial and frontal areas. She feels congested and with pressure. She has a history of migraine as well as sinus problems similar to this, and feels headache is more related to sinus that history of migraine.  Past Medical History  Diagnosis Date  . Anxiety   . Depression   . Obesity   . Migraine   . Fibromyalgia     Past Surgical History  Procedure Date  . Tonsillectomy 1989  . Hernia repair 2010  . Dilation and curettage of uterus   . Cholecystectomy     No family history on file.  History  Substance Use Topics  . Smoking status: Never Smoker   . Smokeless tobacco: Not on file  . Alcohol Use: No    OB History    Grav Para Term Preterm Abortions TAB SAB Ect Mult Living                  Review of Systems  Constitutional: Negative for fever and chills.  Respiratory: Negative.   Cardiovascular: Negative.   Gastrointestinal: Positive for nausea. Negative for vomiting and abdominal pain.  Musculoskeletal: Negative.   Skin: Negative.   Neurological: Positive for headaches. Negative for facial asymmetry and speech difficulty.    Physical Exam  BP 130/66  Pulse 99  Temp(Src) 98.9 F (37.2 C) (Oral)  Resp 16  SpO2 99%  Physical Exam  Constitutional: She is oriented to person, place, and time. She appears well-developed and well-nourished.  HENT:  Head: Normocephalic and atraumatic.  Mouth/Throat:  Oropharynx is clear and moist.       Bilateral maxillary and frontal sinus tenderness that reproduces headache. Nasal mucosa is swollen bilateral nares.   Neck: Normal range of motion. Neck supple.  Cardiovascular: Normal rate and regular rhythm.   Pulmonary/Chest: Effort normal and breath sounds normal.  Abdominal: Soft. Bowel sounds are normal. There is no tenderness. There is no rebound and no guarding.  Musculoskeletal: Normal range of motion.  Neurological: She is alert and oriented to person, place, and time. She has normal strength. No cranial nerve deficit or sensory deficit. Coordination and gait normal.  Skin: Skin is warm and dry. No rash noted.  Psychiatric: She has a normal mood and affect.    ED Course  Procedures She is feeling better, headache is relieved. Will discharge home. MDM       Rodena Medin, PA 11/15/10 1329  Evaluation and management procedures were performed by the PA/NP under my supervision/collaboration.    Felisa Bonier, MD 03/13/11 1450

## 2011-01-22 LAB — URINALYSIS, ROUTINE W REFLEX MICROSCOPIC
Glucose, UA: NEGATIVE
Hgb urine dipstick: NEGATIVE
Ketones, ur: NEGATIVE
Ketones, ur: NEGATIVE
Nitrite: NEGATIVE
Protein, ur: NEGATIVE
Protein, ur: NEGATIVE

## 2011-01-22 LAB — WET PREP, GENITAL
Clue Cells Wet Prep HPF POC: NONE SEEN
Trich, Wet Prep: NONE SEEN
Yeast Wet Prep HPF POC: NONE SEEN

## 2011-01-22 LAB — CBC
MCHC: 35.2
RDW: 12.7

## 2011-01-22 LAB — GC/CHLAMYDIA PROBE AMP, GENITAL: Chlamydia, DNA Probe: NEGATIVE

## 2011-01-22 LAB — HCG, QUANTITATIVE, PREGNANCY: hCG, Beta Chain, Quant, S: 84064 — ABNORMAL HIGH

## 2011-10-06 IMAGING — US US ABDOMEN COMPLETE
1 series · 13 of 25 positions shown · non-contrast
Comparison: None

CLINICAL DATA: Abdominal pain with nausea.

ABDOMINAL ULTRASOUND COMPLETE

[Series 1: us abdomen complete · 0.28mm/px · 13 of 85 slices shown]
[im 1/85]
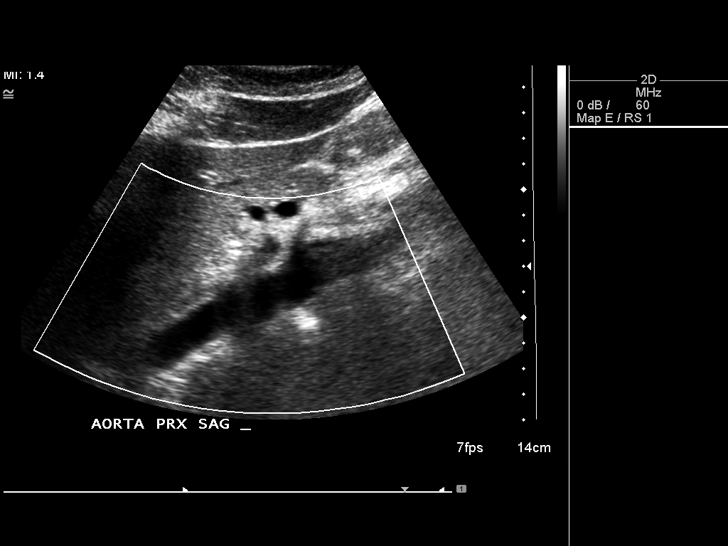
[im 8/85]
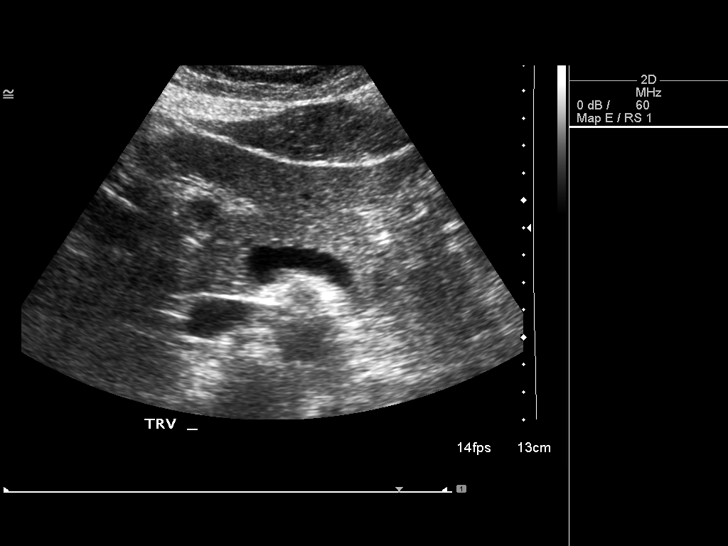
[im 15/85]
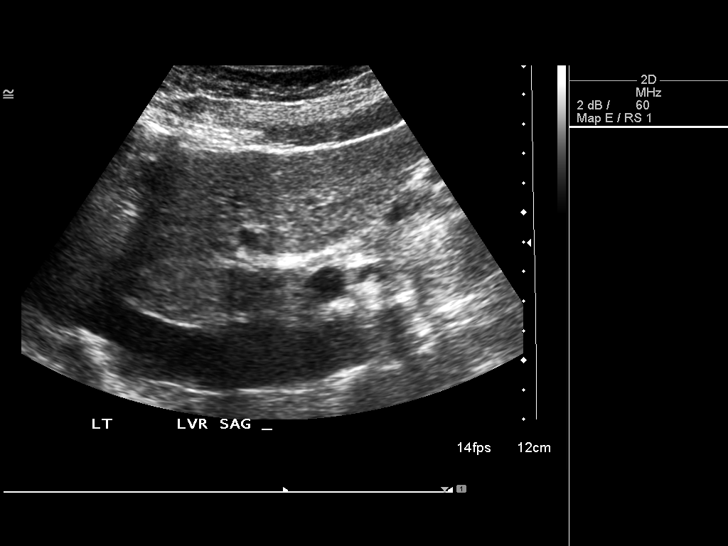
[im 22/85]
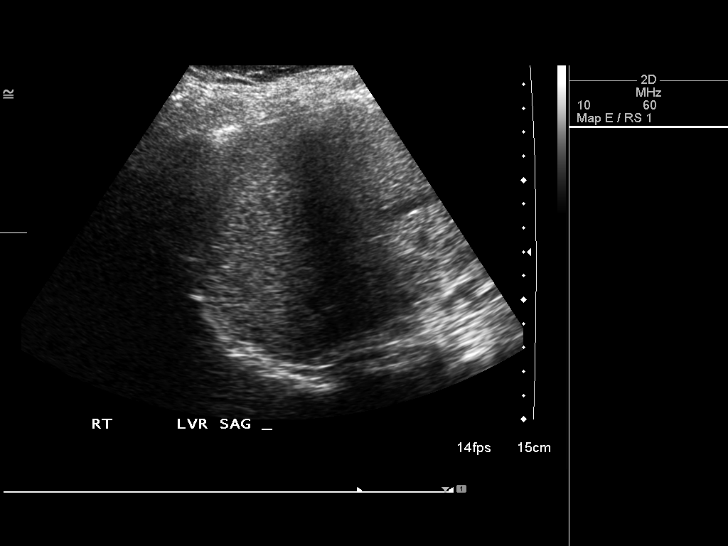
[im 29/85]
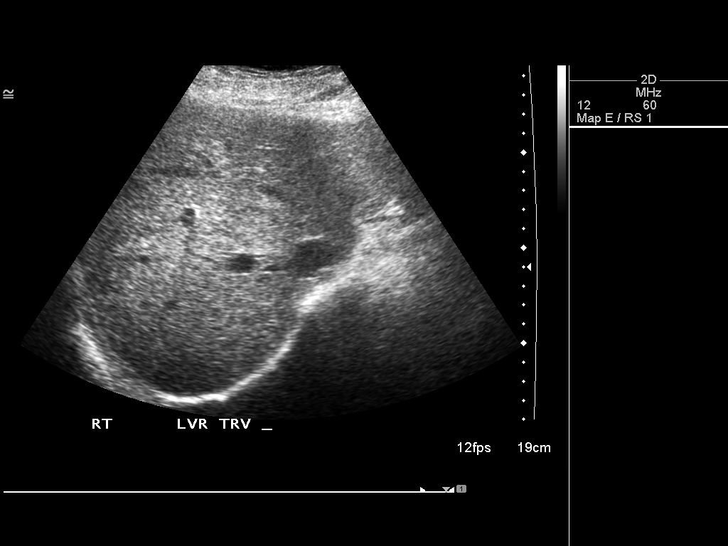
[im 36/85]
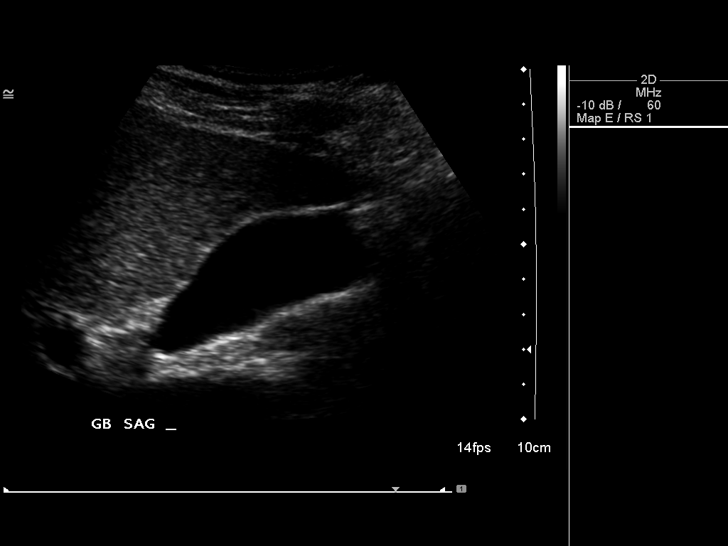
[im 43/85]
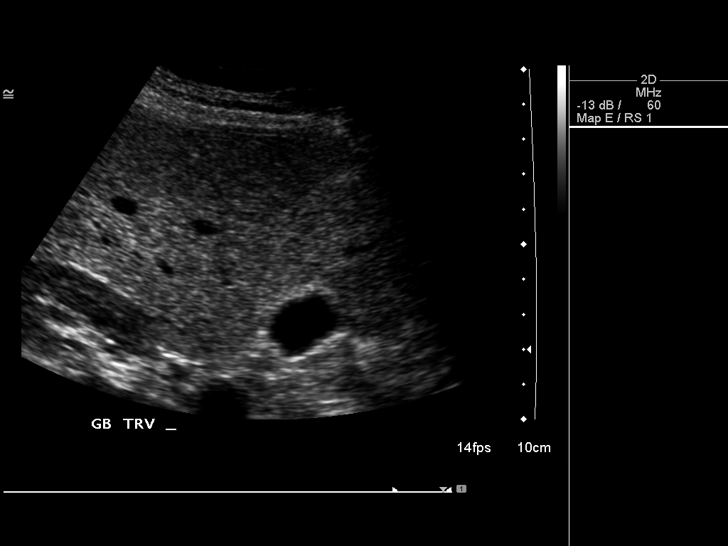
[im 50/85]
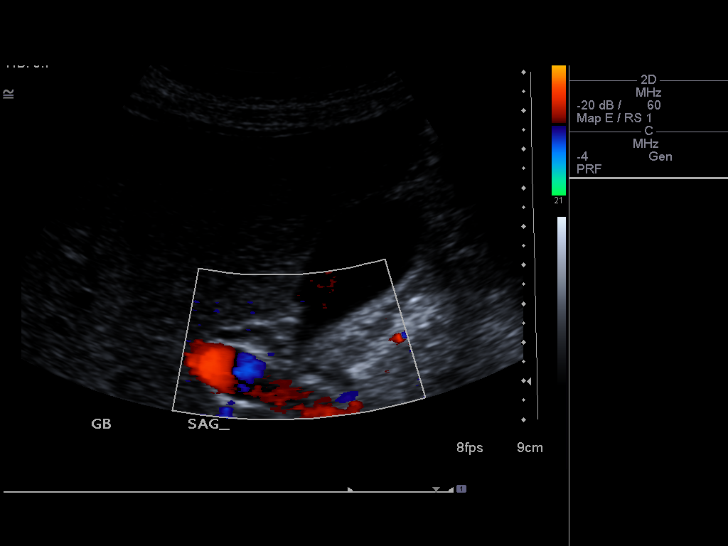
[im 57/85]
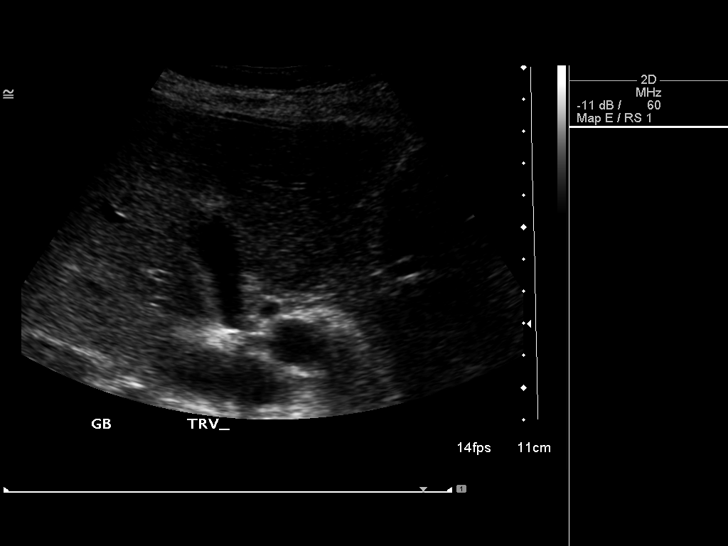
[im 64/85]
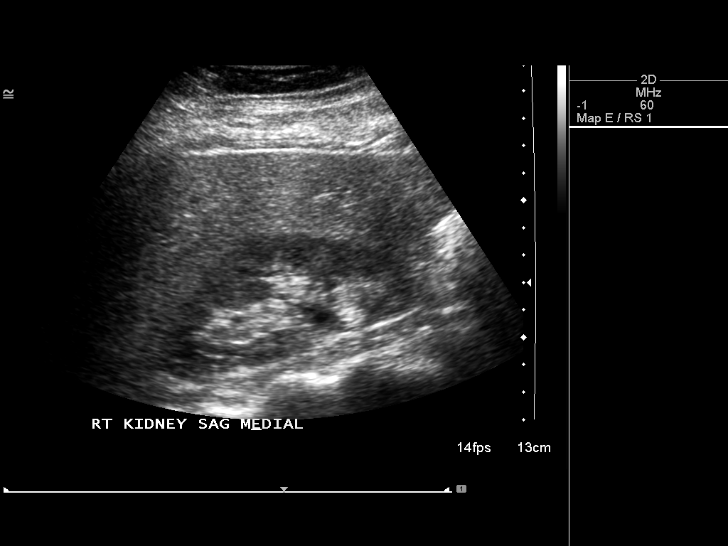
[im 71/85]
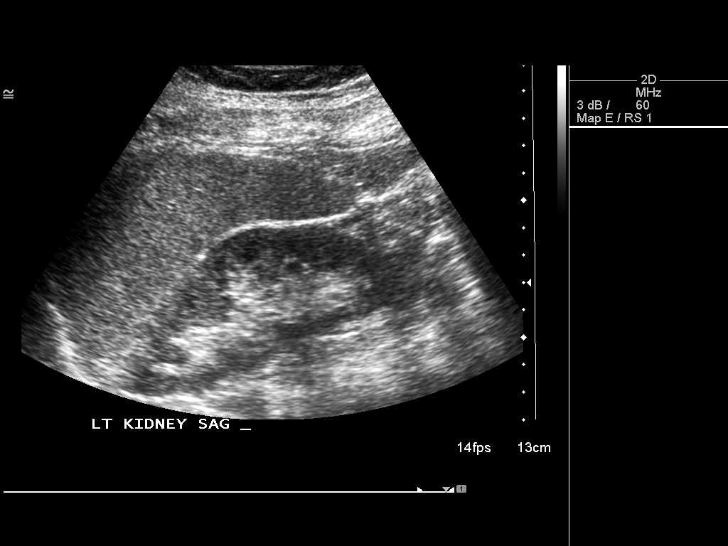
[im 78/85]
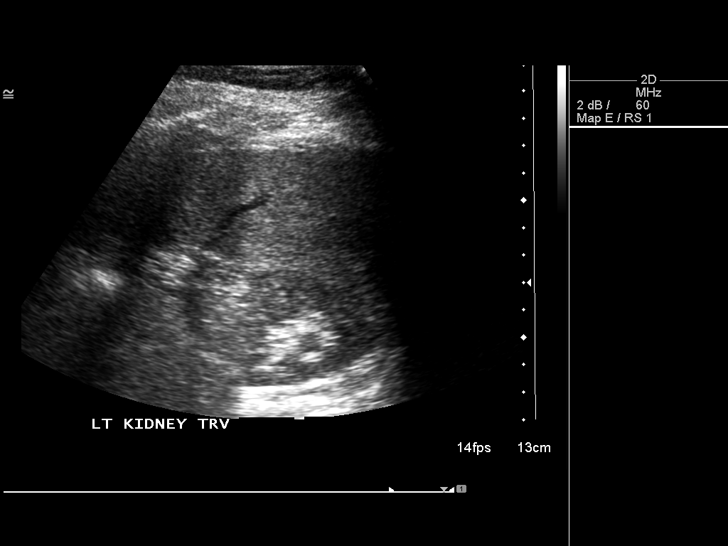
[im 85/85]
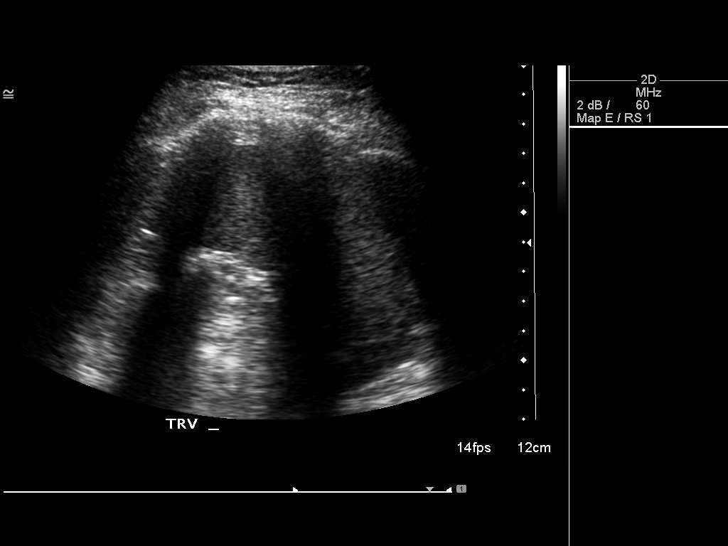

[13 of 25 positions shown; findings below may reference images not displayed]

FINDINGS: Gallbladder:  Minimal gallbladder sludge is noted.  There is no
evidence of gallstones, gallbladder wall thickening, or
pericholecystic fluid.

Common Bile Duct:  There is no evidence of intrahepatic or
extrahepatic biliary dilation. The CBD measures 2.0 mm in greatest
diameter.

Liver:  The liver is within normal limits in parenchymal
echogenicity. No focal abnormalities are identified.

IVC:  Appears normal.

Pancreas:  Although the pancreas is difficult to visualize in its
entirety, no focal pancreatic abnormality is identified.

Spleen:  Within normal limits in size and echotexture.

Right kidney:  The right kidney is normal in size and parenchymal
echogenicity.  There is no evidence of solid mass, hydronephrosis
or definite renal calculi.  The right kidney measures 11.2 cm.

Left kidney:  The left kidney is normal in size and parenchymal
echogenicity.  There is no evidence of solid mass, hydronephrosis
or definite renal calculi.   The left kidney measures 12.1 cm.

Abdominal Aorta:  No abdominal aortic aneurysm identified.

There is no evidence of ascites.
IMPRESSION: Minimal gallbladder sludge otherwise negative abdominal ultrasound.

## 2011-11-26 ENCOUNTER — Other Ambulatory Visit (HOSPITAL_COMMUNITY): Payer: Self-pay | Admitting: Family Medicine

## 2011-11-26 ENCOUNTER — Ambulatory Visit (HOSPITAL_COMMUNITY)
Admission: RE | Admit: 2011-11-26 | Discharge: 2011-11-26 | Disposition: A | Discharge status: Home / Self Care | Payer: Commercial Managed Care - PPO | Source: Ambulatory Visit | Attending: Family Medicine | Admitting: Family Medicine

## 2011-11-26 DIAGNOSIS — N949 Unspecified condition associated with female genital organs and menstrual cycle: Secondary | ICD-10-CM | POA: Insufficient documentation

## 2011-11-26 DIAGNOSIS — Z975 Presence of (intrauterine) contraceptive device: Secondary | ICD-10-CM | POA: Insufficient documentation

## 2011-11-26 DIAGNOSIS — N831 Corpus luteum cyst of ovary, unspecified side: Secondary | ICD-10-CM | POA: Insufficient documentation

## 2011-11-26 DIAGNOSIS — R102 Pelvic and perineal pain: Secondary | ICD-10-CM

## 2012-04-08 ENCOUNTER — Other Ambulatory Visit: Payer: Self-pay | Admitting: Rheumatology

## 2012-04-08 ENCOUNTER — Ambulatory Visit
Admission: RE | Admit: 2012-04-08 | Discharge: 2012-04-08 | Disposition: A | Discharge status: Home / Self Care | Payer: Commercial Managed Care - PPO | Source: Ambulatory Visit | Attending: Rheumatology | Admitting: Rheumatology

## 2012-04-08 DIAGNOSIS — M545 Low back pain: Secondary | ICD-10-CM

## 2012-11-02 ENCOUNTER — Encounter (HOSPITAL_COMMUNITY): Payer: Self-pay | Admitting: Emergency Medicine

## 2012-11-02 ENCOUNTER — Emergency Department (HOSPITAL_COMMUNITY)
Admission: EM | Admit: 2012-11-02 | Discharge: 2012-11-02 | Disposition: A | Discharge status: Home / Self Care | Payer: Commercial Managed Care - PPO | Attending: Emergency Medicine | Admitting: Emergency Medicine

## 2012-11-02 DIAGNOSIS — Z79899 Other long term (current) drug therapy: Secondary | ICD-10-CM | POA: Insufficient documentation

## 2012-11-02 DIAGNOSIS — IMO0001 Reserved for inherently not codable concepts without codable children: Secondary | ICD-10-CM | POA: Insufficient documentation

## 2012-11-02 DIAGNOSIS — F329 Major depressive disorder, single episode, unspecified: Secondary | ICD-10-CM | POA: Insufficient documentation

## 2012-11-02 DIAGNOSIS — E669 Obesity, unspecified: Secondary | ICD-10-CM | POA: Insufficient documentation

## 2012-11-02 DIAGNOSIS — F3289 Other specified depressive episodes: Secondary | ICD-10-CM | POA: Insufficient documentation

## 2012-11-02 DIAGNOSIS — F411 Generalized anxiety disorder: Secondary | ICD-10-CM | POA: Insufficient documentation

## 2012-11-02 DIAGNOSIS — R51 Headache: Secondary | ICD-10-CM | POA: Insufficient documentation

## 2012-11-02 MED ORDER — SODIUM CHLORIDE 0.9 % IV BOLUS (SEPSIS)
1000.0000 mL | Freq: Once | INTRAVENOUS | Status: AC
  Administered 2012-11-02: 1000 mL via INTRAVENOUS

## 2012-11-02 MED ORDER — METOCLOPRAMIDE HCL 5 MG/ML IJ SOLN
10.0000 mg | Freq: Once | INTRAMUSCULAR | Status: AC
  Administered 2012-11-02: 10 mg via INTRAVENOUS
  Filled 2012-11-02: qty 2

## 2012-11-02 MED ORDER — KETOROLAC TROMETHAMINE 30 MG/ML IJ SOLN
30.0000 mg | Freq: Once | INTRAMUSCULAR | Status: AC
  Administered 2012-11-02: 30 mg via INTRAVENOUS
  Filled 2012-11-02: qty 1

## 2012-11-02 MED ORDER — DIPHENHYDRAMINE HCL 50 MG/ML IJ SOLN
25.0000 mg | Freq: Once | INTRAMUSCULAR | Status: AC
  Administered 2012-11-02: 25 mg via INTRAVENOUS
  Filled 2012-11-02: qty 1

## 2012-11-02 MED ORDER — DEXAMETHASONE SODIUM PHOSPHATE 10 MG/ML IJ SOLN
10.0000 mg | Freq: Once | INTRAMUSCULAR | Status: AC
  Administered 2012-11-02: 10 mg via INTRAVENOUS
  Filled 2012-11-02: qty 1

## 2012-11-02 NOTE — ED Provider Notes (Signed)
History    CSN: [id number] Arrival date & time 11/02/12  1159  First MD Initiated Contact with Patient 11/02/12 1203     Chief Complaint  Patient presents with  . Migraine   (Consider location/radiation/quality/duration/timing/severity/associated sxs/prior Treatment) HPI  32 year old female with history of migraine, fibromyalgia, and obesity presents complaining of migraine headache. Patient reports she has a history of fibromyalgia and whenever the pain of the fibromyalgia is severe she developed headache. Reports gradual onset of pounding migraine headache which started since last night. Pain is affecting her left side of head and behind eyes. She endorses light and sound sensitivity, and nausea. She has tried home medications without relief. She denies fever, neck stiffness, chest pain, shortness of breath, numbness, weakness, or rash. She denies any abdominal pain. She has had headache like this in the past. She reported the nausea improve after receiving Zofran via EMS.  Past Medical History  Diagnosis Date  . Anxiety   . Depression   . Obesity   . Migraine   . Fibromyalgia    Past Surgical History  Procedure Laterality Date  . Tonsillectomy  1989  . Hernia repair  2010  . Dilation and curettage of uterus    . Cholecystectomy     No family history on file. History  Substance Use Topics  . Smoking status: Never Smoker   . Smokeless tobacco: Not on file  . Alcohol Use: No   OB History   Grav Para Term Preterm Abortions TAB SAB Ect Mult Living                 Review of Systems  All other systems reviewed and are negative.    Allergies  Percocet and Codeine  Home Medications   Current Outpatient Rx  Name  Route  Sig  Dispense  Refill  . azithromycin (ZITHROMAX Z-PAK) 250 MG tablet      500 mg (2 tablets) today: one tablet each subsequent day x 4.   6 tablet   0   . citalopram (CELEXA) 20 MG tablet   Oral   Take 20 mg by mouth daily.           .  cyclobenzaprine (FLEXERIL) 10 MG tablet   Oral   Take 10 mg by mouth as needed.           . DULoxetine (CYMBALTA) 60 MG capsule   Oral   Take 60 mg by mouth daily.           . propoxyphene-acetaminophen (DARVOCET-N 100) 100-650 MG per tablet   Oral   Take 1 tablet by mouth as needed.           . traMADol (ULTRAM) 50 MG tablet   Oral   Take 50 mg by mouth as needed.           Marland Kitchen EXPIRED: triamcinolone (NASACORT AQ) 55 MCG/ACT nasal inhaler   Nasal   Place 2 sprays into the nose daily.   1 Inhaler   12    BP 130/69  Pulse 91  Temp(Src) 99.3 F (37.4 C) (Oral)  Resp 16  SpO2 97% Physical Exam  Nursing note and vitals reviewed. Constitutional: She is oriented to person, place, and time. She appears well-developed and well-nourished.  Pt laying a dark room, towel over face.  HENT:  Head: Normocephalic and atraumatic.  Mouth/Throat: Oropharynx is clear and moist.  Eyes: Conjunctivae and EOM are normal. Pupils are equal, round, and reactive to light.  Light and sound sensitivitiy  Neck: Neck supple.  No nuchal rigidity  Cardiovascular: Normal rate and regular rhythm.   Pulmonary/Chest: Effort normal and breath sounds normal.  Neurological: She is alert and oriented to person, place, and time. She has normal strength. No cranial nerve deficit or sensory deficit. GCS eye subscore is 4. GCS verbal subscore is 5. GCS motor subscore is 6.  Skin: Skin is warm. No rash noted.  Psychiatric: She has a normal mood and affect.    ED Course  Procedures (including critical care time)  12:30 PM Headache, typical migraine for her.  Will treat with migraine cocktail. No red flags   2:53 PM Patient reports feeling much better after receiving a migraine cocktail. Her headache has not fully resolved but she felt stable to go home and sleep. Return precautions discussed. Patient stable for discharge.  Labs Reviewed - No data to display No results found. 1. Headache     MDM   BP 130/69  Pulse 91  Temp(Src) 99.3 F (37.4 C) (Oral)  Resp 16  SpO2 97%   Fayrene Helper, PA-C 11/02/12 1456

## 2012-11-02 NOTE — ED Provider Notes (Signed)
Medical screening examination/treatment/procedure(s) were performed by non-physician practitioner and as supervising physician I was immediately available for consultation/collaboration.  Ethelda Chick, MD 11/02/12 1501

## 2012-11-02 NOTE — ED Notes (Signed)
Pt reports having a 10/10 migraine with nausea. Pt was given 4 mg of Zofran IV by EMS, pt reports nausea has resolved after Zofran was given.

## 2012-11-02 NOTE — Discharge Instructions (Signed)

## 2012-12-11 ENCOUNTER — Encounter (HOSPITAL_BASED_OUTPATIENT_CLINIC_OR_DEPARTMENT_OTHER): Payer: Self-pay | Admitting: Emergency Medicine

## 2012-12-11 ENCOUNTER — Emergency Department (HOSPITAL_BASED_OUTPATIENT_CLINIC_OR_DEPARTMENT_OTHER)
Admission: EM | Admit: 2012-12-11 | Discharge: 2012-12-11 | Disposition: A | Discharge status: Home / Self Care | Payer: Commercial Managed Care - PPO | Attending: Emergency Medicine | Admitting: Emergency Medicine

## 2012-12-11 DIAGNOSIS — F329 Major depressive disorder, single episode, unspecified: Secondary | ICD-10-CM | POA: Insufficient documentation

## 2012-12-11 DIAGNOSIS — R110 Nausea: Secondary | ICD-10-CM | POA: Insufficient documentation

## 2012-12-11 DIAGNOSIS — G43909 Migraine, unspecified, not intractable, without status migrainosus: Secondary | ICD-10-CM

## 2012-12-11 DIAGNOSIS — IMO0001 Reserved for inherently not codable concepts without codable children: Secondary | ICD-10-CM | POA: Insufficient documentation

## 2012-12-11 DIAGNOSIS — R5381 Other malaise: Secondary | ICD-10-CM | POA: Insufficient documentation

## 2012-12-11 DIAGNOSIS — F411 Generalized anxiety disorder: Secondary | ICD-10-CM | POA: Insufficient documentation

## 2012-12-11 DIAGNOSIS — H53149 Visual discomfort, unspecified: Secondary | ICD-10-CM | POA: Insufficient documentation

## 2012-12-11 DIAGNOSIS — R21 Rash and other nonspecific skin eruption: Secondary | ICD-10-CM | POA: Insufficient documentation

## 2012-12-11 DIAGNOSIS — F3289 Other specified depressive episodes: Secondary | ICD-10-CM | POA: Insufficient documentation

## 2012-12-11 DIAGNOSIS — E669 Obesity, unspecified: Secondary | ICD-10-CM | POA: Insufficient documentation

## 2012-12-11 MED ORDER — MORPHINE SULFATE 4 MG/ML IJ SOLN
6.0000 mg | Freq: Once | INTRAMUSCULAR | Status: AC
  Administered 2012-12-11: 6 mg via INTRAVENOUS
  Filled 2012-12-11: qty 1

## 2012-12-11 MED ORDER — METOCLOPRAMIDE HCL 5 MG/ML IJ SOLN
10.0000 mg | Freq: Once | INTRAMUSCULAR | Status: AC
  Administered 2012-12-11: 10 mg via INTRAMUSCULAR
  Filled 2012-12-11: qty 2

## 2012-12-11 MED ORDER — DIPHENHYDRAMINE HCL 50 MG/ML IJ SOLN
25.0000 mg | Freq: Once | INTRAMUSCULAR | Status: AC
  Administered 2012-12-11: 25 mg via INTRAMUSCULAR
  Filled 2012-12-11: qty 1

## 2012-12-11 MED ORDER — GI COCKTAIL ~~LOC~~
ORAL | Status: AC
  Administered 2012-12-11: 30 mL
  Filled 2012-12-11: qty 30

## 2012-12-11 MED ORDER — MORPHINE SULFATE 2 MG/ML IJ SOLN
INTRAMUSCULAR | Status: AC
  Filled 2012-12-11: qty 1

## 2012-12-11 NOTE — ED Notes (Signed)
[place]  Has taken all of her migraine meds with no improvement.  Nausea but no vomiting.  Red pinpoint rash with white areas surrounding each red dot on legs and arms. No one else in family has the rash. Denies or itching of rash.

## 2012-12-11 NOTE — ED Notes (Signed)
Patient began feeling like she was having shortness of breath. She was diaphoretic and stating that her chest was hurting. Vital signs normal. Dr. Freida Busman was called into the room to see patient. Patient described pain midsternal with belching noted. GI cocktail ordered.

## 2012-12-11 NOTE — ED Provider Notes (Addendum)
CSN: [id number]     Arrival date & time 12/11/12  2023 History    This chart was scribed for Toy Baker, MD by Quintella Reichert, ED scribe.  This patient was seen in room MH12/MH12 and the patient's care was started at 9:01 PM.     Chief Complaint  Patient presents with  . Migraine    The history is provided by the patient. No language interpreter was used.    HPI Comments: Debra Hensley is a 32 y.o. female with h/o migraines and fibromyalgia who presents to the Emergency Department complaining of gradual-onset, gradually-worsening left-sided headache that began 3 days ago but became severe today, with associated nausea and photophobia.  Symptoms are typical for pt's migraines.  She denies any precipitating factors to her knowledge.  Pain extends from the left side of her neck into her left face and head.  She has attempted to treat pain with ibuprofen, Excedrin, hydrocodone and Effexor, without relief.  She denies fever or emesis.  Pt is not being evaluated for her migraines.  She also complains of a red rash to her arms and legs that began yesterday morning and has significantly improved since then.  She denies pain or itching to the rash.  Pt does not have periods due to her IUD.  She denies medication allergies.     Past Medical History  Diagnosis Date  . Anxiety   . Depression   . Obesity   . Migraine   . Fibromyalgia     Past Surgical History  Procedure Laterality Date  . Tonsillectomy  1989  . Hernia repair  2010  . Dilation and curettage of uterus    . Cholecystectomy      No family history on file.   History  Substance Use Topics  . Smoking status: Never Smoker   . Smokeless tobacco: Not on file  . Alcohol Use: No    OB History   Grav Para Term Preterm Abortions TAB SAB Ect Mult Living                   Review of Systems  Constitutional: Negative for fever.  Eyes: Positive for photophobia.  Gastrointestinal: Positive for nausea. Negative for  vomiting.  Skin: Positive for rash.  Neurological: Positive for headaches.  All other systems reviewed and are negative.      Allergies  Percocet and Codeine  Home Medications   Current Outpatient Rx  Name  Route  Sig  Dispense  Refill  . aspirin-acetaminophen-caffeine (EXCEDRIN MIGRAINE) 250-250-65 MG per tablet   Oral   Take 1 tablet by mouth every 6 (six) hours as needed for pain.         Marland Kitchen ALPRAZolam (XANAX) 0.5 MG tablet   Oral   Take 0.5 mg by mouth 3 (three) times daily as needed for anxiety.         . cyclobenzaprine (FLEXERIL) 10 MG tablet   Oral   Take 10 mg by mouth as needed for muscle spasms.          . diphenhydrAMINE (BENADRYL) 25 mg capsule   Oral   Take 25 mg by mouth every 6 (six) hours as needed for itching (Take with hydrocodone).         Marland Kitchen HYDROcodone-acetaminophen (NORCO/VICODIN) 5-325 MG per tablet   Oral   Take 1 tablet by mouth every 6 (six) hours as needed for pain.         Marland Kitchen lamoTRIgine (LAMICTAL) 100  MG tablet   Oral   Take 100 mg by mouth at bedtime.         Marland Kitchen venlafaxine XR (EFFEXOR-XR) 75 MG 24 hr capsule   Oral   Take 75 mg by mouth every evening.          BP 120/85  Pulse 95  Temp(Src) 99.3 F (37.4 C) (Oral)  Resp 16  Ht 5\' 2"  (1.575 m)  Wt 194 lb (87.998 kg)  BMI 35.47 kg/m2  SpO2 100%  Physical Exam  Nursing note and vitals reviewed. Constitutional: She is oriented to person, place, and time. She appears well-developed and well-nourished.  Non-toxic appearance. No distress.  HENT:  Head: Normocephalic and atraumatic.  Eyes: Conjunctivae, EOM and lids are normal. Pupils are equal, round, and reactive to light.  Neck: Normal range of motion. Neck supple. No tracheal deviation present. No mass present.  No signs of nuchal rigidity  Cardiovascular: Normal rate, regular rhythm and normal heart sounds.  Exam reveals no gallop.   No murmur heard. Pulmonary/Chest: Effort normal and breath sounds normal. No  stridor. No respiratory distress. She has no decreased breath sounds. She has no wheezes. She has no rhonchi. She has no rales.  Abdominal: Soft. Normal appearance and bowel sounds are normal. She exhibits no distension. There is no tenderness. There is no rebound and no CVA tenderness.  Musculoskeletal: Normal range of motion. She exhibits tenderness. She exhibits no edema.  Tender around left cervical paraspinal area  Neurological: She is alert and oriented to person, place, and time. She has normal strength. No cranial nerve deficit or sensory deficit. GCS eye subscore is 4. GCS verbal subscore is 5. GCS motor subscore is 6.  Skin: Skin is warm and dry. Rash noted. No abrasion noted.  Papular rash on her extremities with slight erythema, without purpura. No rash on back or palms.  Psychiatric: She has a normal mood and affect. Her speech is normal and behavior is normal.    ED Course  Procedures (including critical care time)  DIAGNOSTIC STUDIES: Oxygen Saturation is 100% on room air, normal by my interpretation.    COORDINATION OF CARE: 9:06 PM-Discussed treatment plan which includes pain medication with pt at bedside and pt agreed to plan.    Labs Reviewed - No data to display No results found. No diagnosis found.   MDM  Patient given medications for headache and is completely resolved. She did have esophageal spasm that was treated with GI cocktail. She is stable for discharge    I personally performed the services described in this documentation, which was scribed in my presence. The recorded information has been reviewed and is accurate.     Toy Baker, MD 12/11/12 2956  Toy Baker, MD 12/11/12 2236

## 2014-04-10 ENCOUNTER — Other Ambulatory Visit: Payer: Self-pay | Admitting: Radiology

## 2014-04-19 ENCOUNTER — Other Ambulatory Visit (INDEPENDENT_AMBULATORY_CARE_PROVIDER_SITE_OTHER): Payer: Self-pay | Admitting: General Surgery

## 2014-04-19 DIAGNOSIS — N6320 Unspecified lump in the left breast, unspecified quadrant: Secondary | ICD-10-CM

## 2014-05-04 DIAGNOSIS — N63 Unspecified lump in unspecified breast: Secondary | ICD-10-CM

## 2014-05-04 HISTORY — DX: Unspecified lump in unspecified breast: N63.0

## 2014-05-08 ENCOUNTER — Encounter (HOSPITAL_BASED_OUTPATIENT_CLINIC_OR_DEPARTMENT_OTHER): Payer: Self-pay | Admitting: *Deleted

## 2014-05-08 NOTE — Pre-Procedure Instructions (Addendum)
To come for CBC, diff, CMET EKG and office note requested from [facility]

## 2014-05-08 NOTE — Pre-Procedure Instructions (Signed)
No EKG from [facility]; hx. of fast heart rate discussed with Dr. Al Corpus; pt. OK to come for surgery.

## 2014-05-13 NOTE — H&P (Signed)
Debra Hensley  Location: [facility] Surgery Patient #: [id number] DOB: [birth year] Married / Language: English / Race: White Female      History of Present Illness  Patient words: New Patient- Left Breast.  The patient is a 34 year old female who presents with a breast mass. This is a 34 year old Caucasian female, referred by Dr. Gabriel Rainwater of because of a palpable mass in the upper outer quadrant of the left breast. The patient is here with her husband who is present with throughout the encounter. Slide the patient has noticed a lump in her left breast for about 1 month. The lump itself is not tender but she has chest wall pains, possibly due to fibromyalgia. No prior history of any breast disorder. Imaging studies included ultrasound and mammogram. This showed dense breast tissues and a 4.7 cm oval mass in the left breast at the 1 o'clock position posteriorly located. They suspected hamartoma. I have reviewed her imaging studies. Image guided biopsy of the left breast upper outer quadrant revealed benign breast parenchyma and was thought to be discordant. She was referred for surgical evaluation.  comorbidities include obesity, mood disorder, fibromyalgia, history of umbilical hernia repair, history cholecystectomy. Family history reveals breast cancer in a paternal aunt and a maternal great aunt. Her mother had ovarian cancer which was caught early and has no evidence of disease. She is married with 3 children. 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 Alcohol rare.   Other Problems  Anxiety Disorder Depression Lump In Breast Migraine Headache Other disease, cancer, significant illness Umbilical Hernia Repair  Past Surgical History  Breast Biopsy Left. Gallbladder Surgery - Laparoscopic Oral Surgery Tonsillectomy  Diagnostic Studies History  Colonoscopy never  Allergies  Codeine Phosphate *ANALGESICS - OPIOID* Itching.  Medication History Cyclobenzaprine HCl  (10MG  Tablet, Oral) Active. LamoTRIgine (200MG  Tablet, Oral) Active. RisperiDONE (1MG  Tablet, Oral) Active.  Social History  Alcohol use Occasional alcohol use. Caffeine use Coffee, Tea. No drug use Tobacco use Never smoker.  Family History Breast Cancer Family Members In General. Cervical Cancer Mother. Depression Father. Hypertension Father, Mother. Migraine Headache Mother. Thyroid problems Sister.  Pregnancy / Birth History  Contraceptive History Intrauterine device.  Review of Systems  General Present- Fatigue. Not Present- Appetite Loss, Chills, Fever, Night Sweats, Weight Gain and Weight Loss. HEENT Present- Wears glasses/contact lenses. Not Present- Earache, Hearing Loss, Hoarseness, Nose Bleed, Oral Ulcers, Ringing in the Ears, Seasonal Allergies, Sinus Pain, Sore Throat, Visual Disturbances and Yellow Eyes. Breast Present- Breast Mass and Breast Pain. Not Present- Nipple Discharge and Skin Changes. Musculoskeletal Present- Muscle Pain. Not Present- Back Pain, Joint Pain, Joint Stiffness, Muscle Weakness and Swelling of Extremities. Neurological Present- Headaches. Not Present- Decreased Memory, Fainting, Numbness, Seizures, Tingling, Tremor, Trouble walking and Weakness. Psychiatric Present- Anxiety. Not Present- Bipolar, Change in Sleep Pattern, Depression, Fearful and Frequent crying. Endocrine Not Present- Cold Intolerance, Excessive Hunger, Hair Changes, Heat Intolerance, Hot flashes and New Diabetes. Hematology Present- Easy Bruising. Not Present- Excessive bleeding, Gland problems, HIV and Persistent Infections.   Vitals   Weight: 195.25 lb Height: 60in Body Surface Area: 1.94 m Body Mass Index: 38.13 kg/m Temp.: 98.63F(Oral)  Pulse: 69 (Regular)  BP: 118/78 (Sitting, Left Arm, Standard)    Physical Exam  General Mental Status-Alert. General Appearance-Consistent with stated age. Hydration-Well  hydrated. Voice-Normal. Note: BMI 38.13.   Head and Neck Head-normocephalic, atraumatic with no lesions or palpable masses. Trachea-midline. Thyroid Gland Characteristics - normal size and consistency.  Eye Eyeball - Bilateral-Extraocular movements intact. Sclera/Conjunctiva -  Bilateral-No scleral icterus.  Chest and Lung Exam Chest and lung exam reveals -quiet, even and easy respiratory effort with no use of accessory muscles and on auscultation, normal breath sounds, no adventitious sounds and normal vocal resonance. Inspection Chest Wall - Normal. Back - normal.  Breast Breast - Left-Symmetric and Biopsy scar, Non Tender, No Dimpling, No Inflammation, No Lumpectomy scars, No Mastectomy scars, No Peau d' Orange. Breast - Right-Symmetric, Non Tender, No Biopsy scars, no Dimpling, No Inflammation, No Lumpectomy scars, No Mastectomy scars, No Peau d' Orange. Breast Lump-No Palpable Breast Mass. Note: Left breast exam reveals a 3 cm smooth palpable mass at the 1 o'clock position. It is a biopsy scar and some ecchymoses lateral to this. There is no other mass in either breast. No axillary adenopathy.   Cardiovascular Cardiovascular examination reveals -normal heart sounds, regular rate and rhythm with no murmurs and normal pedal pulses bilaterally.  Abdomen Inspection Inspection of the abdomen reveals - No Hernias. Palpation/Percussion Palpation and Percussion of the abdomen reveal - Soft, Non Tender, No Rebound tenderness, No Rigidity (guarding) and No hepatosplenomegaly. Auscultation Auscultation of the abdomen reveals - Bowel sounds normal. Note: Scar below umbilicus. Laparoscopic scars. No hernia detected.   Neurologic Neurologic evaluation reveals -alert and oriented x 3 with no impairment of recent or remote memory. Mental Status-Normal.  Musculoskeletal Normal Exam - Left-Upper Extremity Strength Normal and Lower Extremity Strength  Normal. Normal Exam - Right-Upper Extremity Strength Normal and Lower Extremity Strength Normal.  Lymphatic Head & Neck  General Head & Neck Lymphatics: Bilateral - Description - Normal. Axillary  General Axillary Region: Bilateral - Description - Normal. Tenderness - Non Tender. Femoral & Inguinal  Generalized Femoral & Inguinal Lymphatics: Bilateral - Description - Normal. Tenderness - Non Tender.    Assessment & Plan  LEFT BREAST MASS (611.72  N63) Current Plans   The lump in your left breast is obvious and palpable and it is a solid mass. This is probably a benign, noncancerous tumor Because of the size of this and your family history this area should be conservatively excised. We have discussed the techniques and risks of this in detail You will be scheduled for left breast lumpectomy with radioactive seed localization in the near future  FIBROMYALGIA (729.1  M79.7)  BMI 38.0-38.9,ADULT (V85.38  Z68.38)  FAMILY HISTORY OF BREAST CANCER (V16.3  Z80.3)  MOOD DISORDER (296.90  F39)     Keedan Sample M. Dalbert Batman, M.D., [facility] General and Minimally invasive Surgery Breast and Colorectal Surgery Office:   [phone] Pager:   [phone]

## 2014-05-14 ENCOUNTER — Encounter (HOSPITAL_BASED_OUTPATIENT_CLINIC_OR_DEPARTMENT_OTHER)
Admission: RE | Admit: 2014-05-14 | Discharge: 2014-05-14 | Disposition: A | Discharge status: Home / Self Care | Payer: Commercial Managed Care - PPO | Source: Ambulatory Visit | Attending: General Surgery | Admitting: General Surgery

## 2014-05-14 DIAGNOSIS — N63 Unspecified lump in breast: Secondary | ICD-10-CM | POA: Diagnosis present

## 2014-05-14 DIAGNOSIS — Z6838 Body mass index (BMI) 38.0-38.9, adult: Secondary | ICD-10-CM | POA: Diagnosis not present

## 2014-05-14 DIAGNOSIS — E669 Obesity, unspecified: Secondary | ICD-10-CM | POA: Diagnosis not present

## 2014-05-14 DIAGNOSIS — N641 Fat necrosis of breast: Secondary | ICD-10-CM | POA: Diagnosis not present

## 2014-05-14 DIAGNOSIS — Z803 Family history of malignant neoplasm of breast: Secondary | ICD-10-CM | POA: Diagnosis not present

## 2014-05-14 DIAGNOSIS — F419 Anxiety disorder, unspecified: Secondary | ICD-10-CM | POA: Diagnosis not present

## 2014-05-14 DIAGNOSIS — Z885 Allergy status to narcotic agent status: Secondary | ICD-10-CM | POA: Diagnosis not present

## 2014-05-14 DIAGNOSIS — F39 Unspecified mood [affective] disorder: Secondary | ICD-10-CM | POA: Diagnosis not present

## 2014-05-14 DIAGNOSIS — M797 Fibromyalgia: Secondary | ICD-10-CM | POA: Diagnosis not present

## 2014-05-14 DIAGNOSIS — Z8041 Family history of malignant neoplasm of ovary: Secondary | ICD-10-CM | POA: Diagnosis not present

## 2014-05-14 DIAGNOSIS — F329 Major depressive disorder, single episode, unspecified: Secondary | ICD-10-CM | POA: Diagnosis not present

## 2014-05-14 LAB — CBC WITH DIFFERENTIAL/PLATELET
Basophils Absolute: 0 10*3/uL (ref 0.0–0.1)
Basophils Relative: 1 % (ref 0–1)
EOS ABS: 0.2 10*3/uL (ref 0.0–0.7)
EOS PCT: 4 % (ref 0–5)
HCT: 40.3 % (ref 36.0–46.0)
Hemoglobin: 13.7 g/dL (ref 12.0–15.0)
LYMPHS ABS: 1.9 10*3/uL (ref 0.7–4.0)
Lymphocytes Relative: 30 % (ref 12–46)
MCH: 30.2 pg (ref 26.0–34.0)
MCHC: 34 g/dL (ref 30.0–36.0)
MCV: 88.8 fL (ref 78.0–100.0)
Monocytes Absolute: 0.5 10*3/uL (ref 0.1–1.0)
Monocytes Relative: 7 % (ref 3–12)
Neutro Abs: 3.9 10*3/uL (ref 1.7–7.7)
Neutrophils Relative %: 58 % (ref 43–77)
Platelets: 276 10*3/uL (ref 150–400)
RBC: 4.54 MIL/uL (ref 3.87–5.11)
RDW: 12.6 % (ref 11.5–15.5)
WBC: 6.6 10*3/uL (ref 4.0–10.5)

## 2014-05-14 LAB — COMPREHENSIVE METABOLIC PANEL
ALBUMIN: 4.4 g/dL (ref 3.5–5.2)
ALK PHOS: 85 U/L (ref 39–117)
ALT: 20 U/L (ref 0–35)
AST: 17 U/L (ref 0–37)
Anion gap: 9 (ref 5–15)
BILIRUBIN TOTAL: 0.7 mg/dL (ref 0.3–1.2)
BUN: 9 mg/dL (ref 6–23)
CO2: 23 mmol/L (ref 19–32)
Calcium: 9 mg/dL (ref 8.4–10.5)
Chloride: 105 mEq/L (ref 96–112)
Creatinine, Ser: 0.74 mg/dL (ref 0.50–1.10)
Glucose, Bld: 89 mg/dL (ref 70–99)
POTASSIUM: 4.3 mmol/L (ref 3.5–5.1)
SODIUM: 137 mmol/L (ref 135–145)
TOTAL PROTEIN: 7.4 g/dL (ref 6.0–8.3)

## 2014-05-15 ENCOUNTER — Ambulatory Visit (HOSPITAL_BASED_OUTPATIENT_CLINIC_OR_DEPARTMENT_OTHER): Payer: Commercial Managed Care - PPO | Admitting: Anesthesiology

## 2014-05-15 ENCOUNTER — Encounter (HOSPITAL_BASED_OUTPATIENT_CLINIC_OR_DEPARTMENT_OTHER): Payer: Self-pay | Admitting: Anesthesiology

## 2014-05-15 ENCOUNTER — Ambulatory Visit (HOSPITAL_BASED_OUTPATIENT_CLINIC_OR_DEPARTMENT_OTHER)
Admission: RE | Admit: 2014-05-15 | Discharge: 2014-05-15 | Disposition: A | Discharge status: Home / Self Care | Payer: Commercial Managed Care - PPO | Source: Ambulatory Visit | Attending: General Surgery | Admitting: General Surgery

## 2014-05-15 ENCOUNTER — Encounter (HOSPITAL_BASED_OUTPATIENT_CLINIC_OR_DEPARTMENT_OTHER)
Admission: RE | Disposition: A | Discharge status: Home / Self Care | Payer: Self-pay | Source: Ambulatory Visit | Attending: General Surgery

## 2014-05-15 DIAGNOSIS — Z885 Allergy status to narcotic agent status: Secondary | ICD-10-CM | POA: Insufficient documentation

## 2014-05-15 DIAGNOSIS — N6320 Unspecified lump in the left breast, unspecified quadrant: Secondary | ICD-10-CM

## 2014-05-15 DIAGNOSIS — Z6838 Body mass index (BMI) 38.0-38.9, adult: Secondary | ICD-10-CM | POA: Insufficient documentation

## 2014-05-15 DIAGNOSIS — F39 Unspecified mood [affective] disorder: Secondary | ICD-10-CM | POA: Insufficient documentation

## 2014-05-15 DIAGNOSIS — E669 Obesity, unspecified: Secondary | ICD-10-CM | POA: Insufficient documentation

## 2014-05-15 DIAGNOSIS — N641 Fat necrosis of breast: Secondary | ICD-10-CM | POA: Insufficient documentation

## 2014-05-15 DIAGNOSIS — M797 Fibromyalgia: Secondary | ICD-10-CM | POA: Insufficient documentation

## 2014-05-15 DIAGNOSIS — Z8041 Family history of malignant neoplasm of ovary: Secondary | ICD-10-CM | POA: Insufficient documentation

## 2014-05-15 DIAGNOSIS — Z803 Family history of malignant neoplasm of breast: Secondary | ICD-10-CM | POA: Insufficient documentation

## 2014-05-15 DIAGNOSIS — F419 Anxiety disorder, unspecified: Secondary | ICD-10-CM | POA: Insufficient documentation

## 2014-05-15 DIAGNOSIS — F329 Major depressive disorder, single episode, unspecified: Secondary | ICD-10-CM | POA: Insufficient documentation

## 2014-05-15 HISTORY — DX: Other complications of anesthesia, initial encounter: T88.59XA

## 2014-05-15 HISTORY — DX: Tachycardia, unspecified: R00.0

## 2014-05-15 HISTORY — DX: Unspecified mood (affective) disorder: F39

## 2014-05-15 HISTORY — DX: Unspecified lump in unspecified breast: N63.0

## 2014-05-15 HISTORY — DX: Adverse effect of unspecified anesthetic, initial encounter: T41.45XA

## 2014-05-15 HISTORY — PX: BREAST LUMPECTOMY WITH RADIOACTIVE SEED LOCALIZATION: SHX6424

## 2014-05-15 SURGERY — BREAST LUMPECTOMY WITH RADIOACTIVE SEED LOCALIZATION
Anesthesia: General | Site: Breast | Laterality: Left

## 2014-05-15 MED ORDER — CEFAZOLIN SODIUM-DEXTROSE 2-3 GM-% IV SOLR: 2.0000 g | INTRAVENOUS | Status: DC

## 2014-05-15 MED ORDER — MIDAZOLAM HCL 2 MG/2ML IJ SOLN
INTRAMUSCULAR | Status: AC
  Filled 2014-05-15: qty 2

## 2014-05-15 MED ORDER — CEFAZOLIN SODIUM-DEXTROSE 2-3 GM-% IV SOLR
INTRAVENOUS | Status: AC
  Filled 2014-05-15: qty 50

## 2014-05-15 MED ORDER — CHLORHEXIDINE GLUCONATE 4 % EX LIQD: 1.0000 "application " | Freq: Once | CUTANEOUS | Status: DC

## 2014-05-15 MED ORDER — FENTANYL CITRATE 0.05 MG/ML IJ SOLN
INTRAMUSCULAR | Status: AC
  Filled 2014-05-15: qty 6

## 2014-05-15 MED ORDER — PROPOFOL 10 MG/ML IV BOLUS
INTRAVENOUS | Status: DC | PRN
  Administered 2014-05-15: 200 mg via INTRAVENOUS

## 2014-05-15 MED ORDER — HYDROMORPHONE HCL 1 MG/ML IJ SOLN
INTRAMUSCULAR | Status: AC
  Filled 2014-05-15: qty 1

## 2014-05-15 MED ORDER — FENTANYL CITRATE 0.05 MG/ML IJ SOLN: 50.0000 ug | INTRAMUSCULAR | Status: DC | PRN

## 2014-05-15 MED ORDER — LACTATED RINGERS IV SOLN
INTRAVENOUS | Status: DC
  Administered 2014-05-15 (×2): via INTRAVENOUS

## 2014-05-15 MED ORDER — OXYCODONE HCL 5 MG/5ML PO SOLN: 5.0000 mg | Freq: Once | ORAL | Status: DC | PRN

## 2014-05-15 MED ORDER — CEFAZOLIN SODIUM-DEXTROSE 2-3 GM-% IV SOLR
INTRAVENOUS | Status: DC | PRN
  Administered 2014-05-15: 2 g via INTRAVENOUS

## 2014-05-15 MED ORDER — MIDAZOLAM HCL 2 MG/ML PO SYRP: 12.0000 mg | ORAL_SOLUTION | Freq: Once | ORAL | Status: DC | PRN

## 2014-05-15 MED ORDER — DEXAMETHASONE SODIUM PHOSPHATE 4 MG/ML IJ SOLN
INTRAMUSCULAR | Status: DC | PRN
  Administered 2014-05-15: 10 mg via INTRAVENOUS

## 2014-05-15 MED ORDER — MIDAZOLAM HCL 5 MG/5ML IJ SOLN
INTRAMUSCULAR | Status: DC | PRN
  Administered 2014-05-15: 2 mg via INTRAVENOUS

## 2014-05-15 MED ORDER — DIPHENHYDRAMINE HCL 50 MG/ML IJ SOLN
INTRAMUSCULAR | Status: AC
  Filled 2014-05-15: qty 1

## 2014-05-15 MED ORDER — SCOPOLAMINE 1 MG/3DAYS TD PT72
MEDICATED_PATCH | TRANSDERMAL | Status: AC
  Filled 2014-05-15: qty 1

## 2014-05-15 MED ORDER — OXYCODONE HCL 5 MG PO TABS: 5.0000 mg | ORAL_TABLET | Freq: Once | ORAL | Status: DC | PRN

## 2014-05-15 MED ORDER — FENTANYL CITRATE 0.05 MG/ML IJ SOLN
INTRAMUSCULAR | Status: DC | PRN
  Administered 2014-05-15 (×5): 50 ug via INTRAVENOUS

## 2014-05-15 MED ORDER — HYDROCODONE-ACETAMINOPHEN 5-325 MG PO TABS: 1.0000 | ORAL_TABLET | Freq: Four times a day (QID) | ORAL | Status: DC | PRN

## 2014-05-15 MED ORDER — MIDAZOLAM HCL 2 MG/2ML IJ SOLN: 1.0000 mg | INTRAMUSCULAR | Status: DC | PRN

## 2014-05-15 MED ORDER — LIDOCAINE HCL (CARDIAC) 20 MG/ML IV SOLN
INTRAVENOUS | Status: DC | PRN
  Administered 2014-05-15: 80 mg via INTRAVENOUS

## 2014-05-15 MED ORDER — DIPHENHYDRAMINE HCL 50 MG/ML IJ SOLN
25.0000 mg | Freq: Once | INTRAMUSCULAR | Status: AC
  Administered 2014-05-15: 25 mg via INTRAVENOUS

## 2014-05-15 MED ORDER — HYDROMORPHONE HCL 1 MG/ML IJ SOLN
0.2500 mg | INTRAMUSCULAR | Status: DC | PRN
  Administered 2014-05-15: 0.25 mg via INTRAVENOUS
  Administered 2014-05-15: 0.5 mg via INTRAVENOUS
  Administered 2014-05-15: 0.25 mg via INTRAVENOUS
  Administered 2014-05-15: 0.5 mg via INTRAVENOUS

## 2014-05-15 MED ORDER — BUPIVACAINE-EPINEPHRINE (PF) 0.5% -1:200000 IJ SOLN
INTRAMUSCULAR | Status: DC | PRN
Start: 2014-05-15 — End: 2014-05-15
  Administered 2014-05-15: 10 mL

## 2014-05-15 MED ORDER — SCOPOLAMINE 1 MG/3DAYS TD PT72
1.0000 | MEDICATED_PATCH | TRANSDERMAL | Status: DC
  Administered 2014-05-15: 1.5 mg via TRANSDERMAL

## 2014-05-15 MED ORDER — ONDANSETRON HCL 4 MG/2ML IJ SOLN: 4.0000 mg | Freq: Once | INTRAMUSCULAR | Status: DC | PRN

## 2014-05-15 SURGICAL SUPPLY — 61 items
APPLIER CLIP 9.375 MED OPEN (MISCELLANEOUS)
BENZOIN TINCTURE PRP APPL 2/3 (GAUZE/BANDAGES/DRESSINGS) IMPLANT
BINDER BREAST LRG (GAUZE/BANDAGES/DRESSINGS) ×2 IMPLANT
BINDER BREAST MEDIUM (GAUZE/BANDAGES/DRESSINGS) IMPLANT
BINDER BREAST XLRG (GAUZE/BANDAGES/DRESSINGS) IMPLANT
BINDER BREAST XXLRG (GAUZE/BANDAGES/DRESSINGS) IMPLANT
BLADE HEX COATED 2.75 (ELECTRODE) ×2 IMPLANT
BLADE SURG 10 STRL SS (BLADE) IMPLANT
BLADE SURG 15 STRL LF DISP TIS (BLADE) ×1 IMPLANT
BLADE SURG 15 STRL SS (BLADE) ×1
CANISTER SUC SOCK COL 7IN (MISCELLANEOUS) IMPLANT
CANISTER SUCT 1200ML W/VALVE (MISCELLANEOUS) ×2 IMPLANT
CHLORAPREP W/TINT 26ML (MISCELLANEOUS) ×2 IMPLANT
CLIP APPLIE 9.375 MED OPEN (MISCELLANEOUS) IMPLANT
COVER BACK TABLE 60X90IN (DRAPES) ×2 IMPLANT
COVER MAYO STAND STRL (DRAPES) ×2 IMPLANT
COVER PROBE W GEL 5X96 (DRAPES) ×2 IMPLANT
DECANTER SPIKE VIAL GLASS SM (MISCELLANEOUS) IMPLANT
DEVICE DUBIN W/COMP PLATE 8390 (MISCELLANEOUS) ×2 IMPLANT
DRAIN CHANNEL 19F RND (DRAIN) IMPLANT
DRAPE LAPAROSCOPIC ABDOMINAL (DRAPES) ×2 IMPLANT
DRAPE UTILITY XL STRL (DRAPES) ×2 IMPLANT
DRSG PAD ABDOMINAL 8X10 ST (GAUZE/BANDAGES/DRESSINGS) IMPLANT
ELECT REM PT RETURN 9FT ADLT (ELECTROSURGICAL) ×2
ELECTRODE REM PT RTRN 9FT ADLT (ELECTROSURGICAL) ×1 IMPLANT
EVACUATOR SILICONE 100CC (DRAIN) IMPLANT
GLOVE BIOGEL PI IND STRL 7.0 (GLOVE) ×1 IMPLANT
GLOVE BIOGEL PI INDICATOR 7.0 (GLOVE) ×1
GLOVE ECLIPSE 6.5 STRL STRAW (GLOVE) ×2 IMPLANT
GLOVE EUDERMIC 7 POWDERFREE (GLOVE) ×2 IMPLANT
GLOVE EXAM NITRILE EXT CUFF MD (GLOVE) ×2 IMPLANT
GOWN STRL REUS W/ TWL LRG LVL3 (GOWN DISPOSABLE) ×1 IMPLANT
GOWN STRL REUS W/ TWL XL LVL3 (GOWN DISPOSABLE) ×1 IMPLANT
GOWN STRL REUS W/TWL LRG LVL3 (GOWN DISPOSABLE) ×1
GOWN STRL REUS W/TWL XL LVL3 (GOWN DISPOSABLE) ×1
KIT MARKER MARGIN INK (KITS) IMPLANT
LIQUID BAND (GAUZE/BANDAGES/DRESSINGS) ×2 IMPLANT
NEEDLE HYPO 25X1 1.5 SAFETY (NEEDLE) ×2 IMPLANT
NS IRRIG 1000ML POUR BTL (IV SOLUTION) ×2 IMPLANT
PACK BASIN DAY SURGERY FS (CUSTOM PROCEDURE TRAY) ×2 IMPLANT
PENCIL BUTTON HOLSTER BLD 10FT (ELECTRODE) ×2 IMPLANT
PIN SAFETY STERILE (MISCELLANEOUS) IMPLANT
SHEET MEDIUM DRAPE 40X70 STRL (DRAPES) IMPLANT
SLEEVE SCD COMPRESS KNEE MED (MISCELLANEOUS) ×2 IMPLANT
SPONGE GAUZE 4X4 12PLY STER LF (GAUZE/BANDAGES/DRESSINGS) IMPLANT
SPONGE LAP 18X18 X RAY DECT (DISPOSABLE) IMPLANT
SPONGE LAP 4X18 X RAY DECT (DISPOSABLE) ×2 IMPLANT
STRIP CLOSURE SKIN 1/2X4 (GAUZE/BANDAGES/DRESSINGS) IMPLANT
SUT ETHILON 3 0 FSL (SUTURE) IMPLANT
SUT MNCRL AB 4-0 PS2 18 (SUTURE) ×2 IMPLANT
SUT SILK 2 0 SH (SUTURE) ×2 IMPLANT
SUT VIC AB 2-0 CT1 27 (SUTURE)
SUT VIC AB 2-0 CT1 TAPERPNT 27 (SUTURE) IMPLANT
SUT VIC AB 3-0 SH 27 (SUTURE)
SUT VIC AB 3-0 SH 27X BRD (SUTURE) IMPLANT
SUT VICRYL 3-0 CR8 SH (SUTURE) ×2 IMPLANT
SYRINGE 10CC LL (SYRINGE) ×2 IMPLANT
TOWEL OR 17X24 6PK STRL BLUE (TOWEL DISPOSABLE) ×2 IMPLANT
TOWEL OR NON WOVEN STRL DISP B (DISPOSABLE) IMPLANT
TUBE CONNECTING 20X1/4 (TUBING) ×2 IMPLANT
YANKAUER SUCT BULB TIP NO VENT (SUCTIONS) ×2 IMPLANT

## 2014-05-15 NOTE — Op Note (Signed)
Patient Name:           Debra Hensley   Date of Surgery:        05/15/2014  Pre op Diagnosis:      Left breast mass, upper outer quadrant  Post op Diagnosis:    Same  Procedure:                 Left breast lumpectomy with radioactive seed localization and margin assessment  Surgeon:                     Edsel Petrin. Dalbert Batman, M.D., FACS  Assistant:                      OR staff  Operative Indications:    This is a 34 year old Caucasian female, referred by Dr. Christene Slates  because of a palpable mass in the upper outer quadrant of the left breast.  The patient has noticed a lump in her left breast for about 1 month. The lump itself is not tender but she has chest wall pains, possibly due to fibromyalgia. No prior history of any breast disorder. Imaging studies included ultrasound and mammogram. This showed dense breast tissues and a 4.7 cm oval mass in the left breast at the 1 o'clock position posteriorly located. They suspected hamartoma. I have reviewed her imaging studies. Image guided biopsy of the left breast upper outer quadrant revealed benign breast parenchyma and was thought to be discordant. She was referred for surgical evaluation. I can feel a vague, but solitary area of thickening in the upper outer left breast. There is no skin disorder. comorbidities include obesity, mood disorder, fibromyalgia, history of umbilical hernia repair, history cholecystectomy. Family history reveals breast cancer in a paternal aunt and a maternal great aunt. Her mother had ovarian cancer which was caught early and has no evidence of disease.   Operative Findings:       The I-125 radioactive seed was identified in the holding area with the neoprobe. The specimen mammogram showed that the radioactive seed and the prior marker clip were relatively centrally located within the lumpectomy specimen. There was some palpable thickening in this area, but this was indeterminate.  Procedure in Detail:           The radioactive seed was placed at [facility] mammography recently. The patient was brought to the holding area and the neoprobe was used and I could identify the radioactivity in the left breast upper outer quadrant. The patient was brought to the operating room underwent general anesthesia. The left breast was prepped and draped in a sterile fashion. Using the neoprobe I dentified the area on the skin, upper outer quadrant, where the counts were the highest. This area was marked. 0.5% Marcaine with epinephrine was used as local infiltration anesthetic. A curvilinear incision was made in this area. Dissection was carried down into the breast tissue. Using the neoprobe frequently I  dissected around the radioactivity and this was completely removed. The specimen was marked with silk sutures in 3 cardinal positions to orient the pathologist. Specimen mammogram looked good as noted above. The wound was irrigated with saline. Hemostasis was excellent. The breast tissues were reapproximated with interrupted 3-0 Vicryl sutures and the skin closed with a running subcuticular 4-0 Monocryl and Dermabond. Breast binder was placed and the patient taken to PACU in stable condition. EBL 10 mL. Counts correct. Complications none.     Edsel Petrin. Dalbert Batman, M.D., FACS  General and Minimally Invasive Surgery Breast and Colorectal Surgery  05/15/2014 10:53 AM

## 2014-05-15 NOTE — Anesthesia Preprocedure Evaluation (Signed)
Anesthesia Evaluation  Patient identified by MRN, date of birth, ID band Patient awake    Reviewed: Allergy & Precautions, NPO status , Patient's Chart, lab work & pertinent test results  Airway Mallampati: I  TM Distance: >3 FB Neck ROM: Full    Dental  (+) Teeth Intact, Dental Advisory Given   Pulmonary  breath sounds clear to auscultation        Cardiovascular Rhythm:Regular Rate:Normal     Neuro/Psych    GI/Hepatic   Endo/Other    Renal/GU      Musculoskeletal   Abdominal   Peds  Hematology   Anesthesia Other Findings   Reproductive/Obstetrics                             Anesthesia Physical Anesthesia Plan  ASA: II  Anesthesia Plan: General   Post-op Pain Management:    Induction: Intravenous  Airway Management Planned: LMA  Additional Equipment:   Intra-op Plan:   Post-operative Plan: Extubation in OR  Informed Consent: I have reviewed the patients History and Physical, chart, labs and discussed the procedure including the risks, benefits and alternatives for the proposed anesthesia with the patient or authorized representative who has indicated his/her understanding and acceptance.   Dental advisory given  Plan Discussed with: CRNA, Anesthesiologist and Surgeon  Anesthesia Plan Comments:         Anesthesia Quick Evaluation

## 2014-05-15 NOTE — Interval H&P Note (Signed)
History and Physical Interval Note:  05/15/2014 9:45 AM  Debra Hensley  has presented today for surgery, with the diagnosis of left breast mass  The various methods of treatment have been discussed with the patient and family. After consideration of risks, benefits and other options for treatment, the patient has consented to  Procedure(s): LEFT BREAST LUMPECTOMY WITH RADIOACTIVE SEED LOCALIZATION (Left) as a surgical intervention .  The patient's history has been reviewed, patient examined, no change in status, stable for surgery.  I have reviewed the patient's chart and labs.  Questions were answered to the patient's satisfaction.     [name]

## 2014-05-15 NOTE — Anesthesia Postprocedure Evaluation (Signed)
  Anesthesia Post-op Note  Patient: Debra Hensley  Procedure(s) Performed: Procedure(s): LEFT BREAST LUMPECTOMY WITH RADIOACTIVE SEED LOCALIZATION (Left)  Patient Location: PACU  Anesthesia Type: General   Level of Consciousness: awake, alert  and oriented  Airway and Oxygen Therapy: Patient Spontanous Breathing  Post-op Pain: mild  Post-op Assessment: Post-op Vital signs reviewed  Post-op Vital Signs: Reviewed  Last Vitals:  Filed Vitals:   05/15/14 1215  BP: 130/60  Pulse: 80  Temp:   Resp: 13    Complications: No apparent anesthesia complications

## 2014-05-15 NOTE — Transfer of Care (Signed)
Immediate Anesthesia Transfer of Care Note  Patient: Debra Hensley  Procedure(s) Performed: Procedure(s): LEFT BREAST LUMPECTOMY WITH RADIOACTIVE SEED LOCALIZATION (Left)  Patient Location: PACU  Anesthesia Type:General  Level of Consciousness: awake, oriented and patient cooperative  Airway & Oxygen Therapy: Patient Spontanous Breathing and Patient connected to face mask oxygen  Post-op Assessment: Report given to PACU RN and Post -op Vital signs reviewed and stable  Post vital signs: Reviewed and stable  Complications: No apparent anesthesia complications

## 2014-05-15 NOTE — Discharge Instructions (Signed)
[facility] °Office Phone Number [phone] ° °BREAST BIOPSY/ PARTIAL MASTECTOMY: POST OP INSTRUCTIONS ° °Always review your discharge instruction sheet given to you by the facility where your surgery was performed. ° °IF YOU HAVE DISABILITY OR FAMILY LEAVE FORMS, YOU MUST BRING THEM TO THE OFFICE FOR PROCESSING.  DO NOT GIVE THEM TO YOUR DOCTOR. ° °1. A prescription for pain medication may be given to you upon discharge.  Take your pain medication as prescribed, if needed.  If narcotic pain medicine is not needed, then you may take acetaminophen (Tylenol) or ibuprofen (Advil) as needed. °2. Take your usually prescribed medications unless otherwise directed °3. If you need a refill on your pain medication, please contact your pharmacy.  They will contact our office to request authorization.  Prescriptions will not be filled after 5pm or on week-ends. °4. You should eat very light the first 24 hours after surgery, such as soup, crackers, pudding, etc.  Resume your normal diet the day after surgery. °5. Most patients will experience some swelling and bruising in the breast.  Ice packs and a good support bra will help.  Swelling and bruising can take several days to resolve.  °6. It is common to experience some constipation if taking pain medication after surgery.  Increasing fluid intake and taking a stool softener will usually help or prevent this problem from occurring.  A mild laxative (Milk of Magnesia or Miralax) should be taken according to package directions if there are no bowel movements after 48 hours. °7. Unless discharge instructions indicate otherwise, you may remove your bandages 24-48 hours after surgery, and you may shower at that time.  You may have steri-strips (small skin tapes) in place directly over the incision.  These strips should be left on the skin for 7-10 days.  If your surgeon used skin glue on the incision, you may shower in 24 hours.  The glue will flake off over the  next 2-3 weeks.  Any sutures or staples will be removed at the office during your follow-up visit. °8. ACTIVITIES:  You may resume regular daily activities (gradually increasing) beginning the next day.  Wearing a good support bra or sports bra minimizes pain and swelling.  You may have sexual intercourse when it is comfortable. °a. You may drive when you no longer are taking prescription pain medication, you can comfortably wear a seatbelt, and you can safely maneuver your car and apply brakes. °b. RETURN TO WORK:  ______________________________________________________________________________________ °9. You should see your doctor in the office for a follow-up appointment approximately two weeks after your surgery.  Your doctor’s nurse will typically make your follow-up appointment when she calls you with your pathology report.  Expect your pathology report 2-3 business days after your surgery.  You may call to check if you do not hear from us after three days. °10. OTHER INSTRUCTIONS: _______________________________________________________________________________________________ _____________________________________________________________________________________________________________________________________ °_____________________________________________________________________________________________________________________________________ °_____________________________________________________________________________________________________________________________________ ° °WHEN TO CALL YOUR DOCTOR: °1. Fever over 101.0 °2. Nausea and/or vomiting. °3. Extreme swelling or bruising. °4. Continued bleeding from incision. °5. Increased pain, redness, or drainage from the incision. ° °The clinic staff is available to answer your questions during regular business hours.  Please don’t hesitate to call and ask to speak to one of the nurses for clinical concerns.  If you have a medical emergency, go to the nearest  emergency room or call 911.  A surgeon from [facility] is always on call at the hospital. ° °For further questions, please visit [email or web address]  ° ° °  Post Anesthesia Home Care Instructions ° °Activity: °Get plenty of rest for the remainder of the day. A responsible adult should stay with you for 24 hours following the procedure.  °For the next 24 hours, DO NOT: °-Drive a car °-Operate machinery °-Drink alcoholic beverages °-Take any medication unless instructed by your physician °-Make any legal decisions or sign important papers. ° °Meals: °Start with liquid foods such as gelatin or soup. Progress to regular foods as tolerated. Avoid greasy, spicy, heavy foods. If nausea and/or vomiting occur, drink only clear liquids until the nausea and/or vomiting subsides. Call your physician if vomiting continues. ° °Special Instructions/Symptoms: °Your throat may feel dry or sore from the anesthesia or the breathing tube placed in your throat during surgery. If this causes discomfort, gargle with warm salt water. The discomfort should disappear within 24 hours. ° °

## 2014-05-15 NOTE — Anesthesia Procedure Notes (Signed)
Procedure Name: LMA Insertion Date/Time: 05/15/2014 10:22 AM Performed by: Toula Moos L Pre-anesthesia Checklist: Patient identified, Emergency Drugs available, Suction available, Patient being monitored and Timeout performed Patient Re-evaluated:Patient Re-evaluated prior to inductionOxygen Delivery Method: Circle System Utilized Preoxygenation: Pre-oxygenation with 100% oxygen Intubation Type: IV induction Ventilation: Mask ventilation without difficulty LMA: LMA inserted LMA Size: 4.0 Number of attempts: 1 Airway Equipment and Method: bite block Placement Confirmation: positive ETCO2 Tube secured with: Tape Dental Injury: Teeth and Oropharynx as per pre-operative assessment

## 2014-05-16 ENCOUNTER — Encounter (HOSPITAL_BASED_OUTPATIENT_CLINIC_OR_DEPARTMENT_OTHER): Payer: Self-pay | Admitting: General Surgery

## 2014-05-16 NOTE — Progress Notes (Signed)
Quick Note:  Inform patient of Pathology report,. Breast biopsy shows benign fat necrosis. Np cancer. Excellent news. Will discuss in detail at next office visit.  hmi ______

## 2015-02-15 ENCOUNTER — Ambulatory Visit
Admission: RE | Admit: 2015-02-15 | Discharge: 2015-02-15 | Disposition: A | Discharge status: Home / Self Care | Payer: Commercial Managed Care - PPO | Source: Ambulatory Visit | Attending: Family Medicine | Admitting: Family Medicine

## 2015-02-15 ENCOUNTER — Other Ambulatory Visit: Payer: Self-pay | Admitting: Family Medicine

## 2015-02-15 DIAGNOSIS — M25571 Pain in right ankle and joints of right foot: Secondary | ICD-10-CM

## 2015-06-21 ENCOUNTER — Encounter: Payer: Self-pay | Admitting: *Deleted

## 2015-06-21 ENCOUNTER — Ambulatory Visit (INDEPENDENT_AMBULATORY_CARE_PROVIDER_SITE_OTHER): Payer: Commercial Managed Care - PPO | Admitting: Diagnostic Neuroimaging

## 2015-06-21 ENCOUNTER — Encounter: Payer: Self-pay | Admitting: Diagnostic Neuroimaging

## 2015-06-21 VITALS — BP 120/78 | HR 83 | Ht 64.0 in | Wt 203.8 lb

## 2015-06-21 DIAGNOSIS — G43109 Migraine with aura, not intractable, without status migrainosus: Secondary | ICD-10-CM | POA: Diagnosis not present

## 2015-06-21 MED ORDER — SUMATRIPTAN SUCCINATE 100 MG PO TABS: 100.0000 mg | ORAL_TABLET | Freq: Once | ORAL | Status: DC | PRN

## 2015-06-21 MED ORDER — TOPIRAMATE 50 MG PO TABS: 50.0000 mg | ORAL_TABLET | Freq: Two times a day (BID) | ORAL | Status: DC

## 2015-06-21 NOTE — Progress Notes (Signed)
[facility]  PATIENT: Debra Hensley DOB: 11/18/1980  REFERRING CLINICIAN: Cipriano Mile  HISTORY FROM: patient  REASON FOR VISIT: new consult    HISTORICAL  CHIEF COMPLAINT:  Chief Complaint  Patient presents with  . New Evaluation    Rm 7 with young son.  Here for evaluation of new onset paresthesia in arm/leg bilaterally. She also has severe migraines. Has never taken preventative medication for migraines.       HISTORY OF PRESENT ILLNESS:   35 year old right-handed female here for evaluation of numbness and tingling. Symptoms have been present for past 2-3 months. She describes migratory right greater than left side, arms, hands, legs and feet numbness and tingling sensation. Symptoms last minutes at a time can occur multiple times a day. She has these sensations approximately 10 days per month. Symptoms are particularly noticeable during migraine headache attacks.  Patient has had migraine headaches since age 38 years old. She has a warning of scintillating visual phenomenon, followed by severe global headache with neck pain, shoulder pain, nausea, photophobia and phonophobia. When she was younger she would pass out from severe headaches. For most of her life she was having 1 migraine headache every few months. Over past few years this has increased to at least one migraine attack per month, which can last at least 2 days. In the last few months she has been averaging 2-4 days of headache per month. She's never been on preventative medication. She has been on Imitrex 100 mg as needed for rescue medication. This seems to work well. She also takes [phone] mg of Tylenol every day for past few months.  Patient also has history of depression, anxiety and fibromyalgia. She is on Cymbalta, gabapentin and Vicodin as needed.  No other specific triggering factors, change in stress, diet, exercise, sleep. She does have chronic fatigue in spite of adequate rest at nighttime. She  had a sleep study a few years ago which apparently was negative for sleep apnea although she did not sleep well during the study.   REVIEW OF SYSTEMS: Full 14 system review of systems performed and notable only for fatigue headache numbness dizziness sleepiness depression anxiety decreased energy aching muscles.  ALLERGIES: Allergies  Allergen Reactions  . Adhesive [Tape] Other (See Comments)    BLISTERS  . Hydrocodone Itching  . Codeine Itching  . Percocet [Oxycodone-Acetaminophen] Itching    HOME MEDICATIONS: Outpatient Prescriptions Prior to Visit  Medication Sig Dispense Refill  . acetaminophen (TYLENOL) 500 MG tablet Take 500 mg by mouth every 6 (six) hours as needed.    . cyclobenzaprine (FLEXERIL) 10 MG tablet Take 10 mg by mouth as needed for muscle spasms.     . DULoxetine (CYMBALTA) 60 MG capsule Take 60 mg by mouth daily.  5  . gabapentin (NEURONTIN) 100 MG capsule Take 100 mg by mouth.  2  . HYDROcodone-acetaminophen (NORCO) 5-325 MG per tablet Take 1-2 tablets by mouth every 6 (six) hours as needed for moderate pain or severe pain. 30 tablet 0  . SUMAtriptan (IMITREX) 100 MG tablet Take 1 tablet by mouth.  3  . lamoTRIgine (LAMICTAL) 100 MG tablet Take 200 mg by mouth at bedtime.     . naproxen sodium (ANAPROX) 550 MG tablet Take 550 mg by mouth 2 (two) times daily with a meal.     No facility-administered medications prior to visit.    PAST MEDICAL HISTORY: Past Medical History  Diagnosis Date  . Obesity   . Migraine   .  Fibromyalgia   . Mood disorder ([place])   . Breast mass 05/2014    left  . Complication of anesthesia     states had a problem with her heart rate during UHR, and was hard to wake up post-op; could not be more specific, but was not referred to cardiologist  . Tachycardia     states heart rate has been high for last 3 doctor visits, has not been referred to cardiolgist or endocrinologist    PAST SURGICAL HISTORY: Past Surgical History    Procedure Laterality Date  . Tonsillectomy  1989  . Nasal turbinate reduction    . Elbow fracture surgery Right     age 16  . Umbilical hernia repair  2010  . Cholecystectomy  08/01/2010  . Dilation and curettage of uterus  01/06/2002  . Umbilical hernia repair  2010  . Breast lumpectomy with radioactive seed localization Left 05/15/2014    Procedure: LEFT BREAST LUMPECTOMY WITH RADIOACTIVE SEED LOCALIZATION;  Surgeon: Fanny Skates, MD;  Location: [facility];  Service: General;  Laterality: Left;    FAMILY HISTORY: Family History  Problem Relation Age of Onset  . Ovarian cancer Mother   . Migraines Mother   . Migraines Son   . Hypertension Mother     SOCIAL HISTORY:  Social History   Social History  . Marital Status: Married    Spouse Name: Legrand Como  . Number of Children: 3  . Years of Education: 14   Occupational History  . Stay at home    Social History Main Topics  . Smoking status: Never Smoker   . Smokeless tobacco: Never Used  . Alcohol Use: Yes     Comment: rarely  . Drug Use: No  . Sexual Activity: Yes    Birth Control/ Protection: IUD   Other Topics Concern  . Not on file   Social History Narrative   Lives at home with husband and 3 kids      Caffeine use: 1 cup daily        PHYSICAL EXAM  GENERAL EXAM/CONSTITUTIONAL: Vitals:  Filed Vitals:   06/21/15 0851  BP: 120/78  Pulse: 83  Height: 5\' 4"  (1.626 m)  Weight: 203 lb 12.8 oz (92.443 kg)    Wt Readings from Last 3 Encounters:  06/21/15 203 lb 12.8 oz (92.443 kg)  05/15/14 199 lb (90.266 kg)  12/11/12 194 lb (87.998 kg)     Body mass index is 34.96 kg/(m^2).  Visual Acuity Screening   Right eye Left eye Both eyes  Without correction:     With correction: 20/30 20/30 20/20      Patient is in no distress; well developed, nourished and groomed; neck is supple  CARDIOVASCULAR:  Examination of carotid arteries is normal; no carotid bruits  Regular rate and rhythm,  no murmurs  Examination of peripheral vascular system by observation and palpation is normal  EYES:  Ophthalmoscopic exam of optic discs and posterior segments is normal; no papilledema or hemorrhages  MUSCULOSKELETAL:  Gait, strength, tone, movements noted in Neurologic exam below  NEUROLOGIC: MENTAL STATUS:  No flowsheet data found.  awake, alert, oriented to person, place and time  recent and remote memory intact  normal attention and concentration  language fluent, comprehension intact, naming intact,   fund of knowledge appropriate  CRANIAL NERVE:   2nd - no papilledema on fundoscopic exam  2nd, 3rd, 4th, 6th - pupils equal and reactive to light, visual fields full to confrontation, extraocular muscles  intact, no nystagmus  5th - facial sensation symmetric  7th - facial strength symmetric  8th - hearing intact  9th - palate elevates symmetrically, uvula midline  11th - shoulder shrug symmetric  12th - tongue protrusion midline  MOTOR:   normal bulk and tone, full strength in the BUE, BLE  SENSORY:   normal and symmetric to light touch, pinprick, temperature, vibration  COORDINATION:   finger-nose-finger, fine finger movements normal  REFLEXES:   deep tendon reflexes present and symmetric  GAIT/STATION:   narrow based gait; able to walk tandem; romberg is negative    DIAGNOSTIC DATA (LABS, IMAGING, TESTING) - I reviewed patient records, labs, notes, testing and imaging myself where available.  Lab Results  Component Value Date   WBC 6.6 05/14/2014   HGB 13.7 05/14/2014   HCT 40.3 05/14/2014   MCV 88.8 05/14/2014   PLT 276 05/14/2014      Component Value Date/Time   NA 137 05/14/2014 1100   K 4.3 05/14/2014 1100   CL 105 05/14/2014 1100   CO2 23 05/14/2014 1100   GLUCOSE 89 05/14/2014 1100   BUN 9 05/14/2014 1100   CREATININE 0.74 05/14/2014 1100   CALCIUM 9.0 05/14/2014 1100   PROT 7.4 05/14/2014 1100   ALBUMIN 4.4 05/14/2014  1100   AST 17 05/14/2014 1100   ALT 20 05/14/2014 1100   ALKPHOS 85 05/14/2014 1100   BILITOT 0.7 05/14/2014 1100   GFRNONAA >90 05/14/2014 1100   GFRAA >90 05/14/2014 1100   No results found for: CHOL, HDL, LDLCALC, LDLDIRECT, TRIG, CHOLHDL No results found for: HGBA1C No results found for: VITAMINB12 No results found for: TSH  04/08/12 xray lumbar [I reviewed images myself and agree with interpretation. -VRP]  - negative    ASSESSMENT AND PLAN  35 y.o. year old female here with 2-3 months migratory numbness, sometimes assoc with migraine but sometimes independent.  Ddx: migraine phenomenon vs secondary cause (CNS inflamm/autoimmune)  1. Migraine with aura and without status migrainosus, not intractable      PLAN: - check MRI brain - start topiramate 50mg  at bedtime; after 1 week increase to twice a day - continue sumatriptan 100mg  prn - may stop gabapentin if needed to reduce polypharmacy side effects  Orders Placed This Encounter  Procedures  . MR Brain Wo Contrast   Meds ordered this encounter  Medications  . topiramate (TOPAMAX) 50 MG tablet    Sig: Take 1 tablet (50 mg total) by mouth 2 (two) times daily.    Dispense:  60 tablet    Refill:  12  . SUMAtriptan (IMITREX) 100 MG tablet    Sig: Take 1 tablet (100 mg total) by mouth once as needed for migraine. May repeat x 1 after 2 hours; maximum 2 tabs per day and 8 tabs per month    Dispense:  8 tablet    Refill:  6   Return in about 2 months (around 08/19/2015).    Penni Bombard, MD [id number], [id number] AM Certified in Neurology, Neurophysiology and Neuroimaging  [facility] [address] [place], [place] [address] [phone]

## 2015-06-21 NOTE — Patient Instructions (Signed)
Thank you for coming to see [place] at [facility]. I hope we have been able to provide you high quality care today.  You may receive a patient satisfaction survey over the next few weeks. We would appreciate your feedback and comments so that we may continue to improve ourselves and the health of our patients.  - start topiramate 33m at bedtime; after 1 week increase to twice a day; drink plenty of water - continue sumatriptan 1044mas needed for breakthrough headache; may repeat x 1 after 2 hours; max 2 tabs per day or 8 per month - reduce tylenol to 5-10 doses per month - may reduce or stop gabapentin to reduce side effects / fatigue    ~~~~~~~~~~~~~~~~~~~~~~~~~~~~~~~~~~~~~~~~~~~~~~~~~~~~~~~~~~~~~~~~~  DR. PENUMALLI'S GUIDE TO HAPPY AND HEALTHY LIVING These are some of my general health and wellness recommendations. Some of them may apply to you better than others. Please use common sense as you try these suggestions and feel free to ask me any questions.   ACTIVITY/FITNESS Mental, social, emotional and physical stimulation are very important for brain and body health. Try learning a new activity (arts, music, language, sports, games).  Keep moving your body to the best of your abilities. You can do this at home, inside or outside, the park, community center, gym or anywhere you like. Consider a physical therapist or personal trainer to get started. Consider the app Sworkit. Fitness trackers such as smart-watches, smart-phones or Fitbits can help as well.   NUTRITION Eat more plants: colorful vegetables, nuts, seeds and berries.  Eat less sugar, salt, preservatives and processed foods.  Avoid toxins such as cigarettes and alcohol.  Drink water when you are thirsty. Warm water with a slice of lemon is an excellent morning drink to start the day.  Consider these websites for more information The [employer]  (hthttps://www.henry-hernandez.biz/[employer] (wwWindowBlog.ch  RELAXATION Consider practicing mindfulness meditation or other relaxation techniques such as deep breathing, prayer, yoga, tai chi, massage. See website mindful.org or the apps Headspace or Calm to help get started.   SLEEP Try to get at least 7-8+ hours sleep per day. Regular exercise and reduced caffeine will help you sleep better. Practice good sleep hygeine techniques. See website sleep.org for more information.   PLANNING Prepare estate planning, living will, healthcare POA documents. Sometimes this is best planned with the help of an attorney. Theconversationproject.org and agingwithdignity.org are excellent resources.\

## 2015-08-02 ENCOUNTER — Ambulatory Visit (INDEPENDENT_AMBULATORY_CARE_PROVIDER_SITE_OTHER): Payer: Commercial Managed Care - PPO | Admitting: Diagnostic Neuroimaging

## 2015-08-02 ENCOUNTER — Encounter: Payer: Self-pay | Admitting: Diagnostic Neuroimaging

## 2015-08-02 VITALS — BP 130/81 | HR 120 | Ht 64.0 in | Wt 202.6 lb

## 2015-08-02 DIAGNOSIS — R200 Anesthesia of skin: Secondary | ICD-10-CM | POA: Diagnosis not present

## 2015-08-02 DIAGNOSIS — R000 Tachycardia, unspecified: Secondary | ICD-10-CM | POA: Diagnosis not present

## 2015-08-02 DIAGNOSIS — G43109 Migraine with aura, not intractable, without status migrainosus: Secondary | ICD-10-CM

## 2015-08-02 MED ORDER — PROMETHAZINE HCL 12.5 MG PO TABS: 12.5000 mg | ORAL_TABLET | Freq: Three times a day (TID) | ORAL | Status: DC | PRN

## 2015-08-02 MED ORDER — RIZATRIPTAN BENZOATE 10 MG PO TBDP: 10.0000 mg | ORAL_TABLET | ORAL | Status: DC | PRN

## 2015-08-02 MED ORDER — ALPRAZOLAM 0.5 MG PO TABS: 0.5000 mg | ORAL_TABLET | ORAL | Status: DC | PRN

## 2015-08-02 MED ORDER — TOPIRAMATE 50 MG PO TABS: 50.0000 mg | ORAL_TABLET | Freq: Two times a day (BID) | ORAL | Status: DC

## 2015-08-02 NOTE — Progress Notes (Signed)
[facility]  PATIENT: Debra Hensley DOB: [birth year]  REFERRING CLINICIAN: Cipriano Mile  HISTORY FROM: patient  REASON FOR VISIT: follow up   HISTORICAL  CHIEF COMPLAINT:  Chief Complaint  Patient presents with  . Migraine    rm 7, "migraine a wk ago, lasted 4 days; avg 1 migraine every 2 weeks"  . Follow-up    2 month    HISTORY OF PRESENT ILLNESS:   UPDATE 08/02/15: Since last visit, now on TPX + sumatriptan. Still 1 HA every other week. Last week had migraine lasting 4 days. No triggers noted for HA. Int numbness still occuring.   PRIOR HPI 06/21/15: 35 year old right-handed female here for evaluation of numbness and tingling. Symptoms have been present for past 2-3 months. She describes migratory right greater than left side, arms, hands, legs and feet numbness and tingling sensation. Symptoms last minutes at a time can occur multiple times a day. She has these sensations approximately 10 days per month. Symptoms are particularly noticeable during migraine headache attacks. Patient has had migraine headaches since age 35 years old. She has a warning of scintillating visual phenomenon, followed by severe global headache with neck pain, shoulder pain, nausea, photophobia and phonophobia. When she was younger she would pass out from severe headaches. For most of her life she was having 1 migraine headache every few months. Over past few years this has increased to at least one migraine attack per month, which can last at least 2 days. In the last few months she has been averaging 2-4 days of headache per month. She's never been on preventative medication. She has been on Imitrex 100 mg as needed for rescue medication. This seems to work well. She also takes [phone] mg of Tylenol every day for past few months. Patient also has history of depression, anxiety and fibromyalgia. She is on Cymbalta, gabapentin and Vicodin as needed. No other specific triggering factors, change in  stress, diet, exercise, sleep. She does have chronic fatigue in spite of adequate rest at nighttime. She had a sleep study a few years ago which apparently was negative for sleep apnea although she did not sleep well during the study.   REVIEW OF SYSTEMS: Full 14 system review of systems performed and negative except: fatigue headache numbness dizziness sleepiness depression anxiety decreased energy aching muscles.  ALLERGIES: Allergies  Allergen Reactions  . Adhesive [Tape] Other (See Comments)    BLISTERS  . Hydrocodone Itching  . Codeine Itching  . Percocet [Oxycodone-Acetaminophen] Itching    HOME MEDICATIONS: Outpatient Prescriptions Prior to Visit  Medication Sig Dispense Refill  . acetaminophen (TYLENOL) 500 MG tablet Take 500 mg by mouth every 6 (six) hours as needed.    . cyclobenzaprine (FLEXERIL) 10 MG tablet Take 10 mg by mouth as needed for muscle spasms.     . DULoxetine (CYMBALTA) 60 MG capsule Take 60 mg by mouth daily.  5  . gabapentin (NEURONTIN) 100 MG capsule Take 100 mg by mouth.  2  . HYDROcodone-acetaminophen (NORCO) 5-325 MG per tablet Take 1-2 tablets by mouth every 6 (six) hours as needed for moderate pain or severe pain. 30 tablet 0  . SUMAtriptan (IMITREX) 100 MG tablet Take 1 tablet (100 mg total) by mouth once as needed for migraine. May repeat x 1 after 2 hours; maximum 2 tabs per day and 8 tabs per month 8 tablet 6  . topiramate (TOPAMAX) 50 MG tablet Take 1 tablet (50 mg total) by mouth 2 (two)  times daily. 60 tablet 12  . triamcinolone cream (KENALOG) 0.5 % Apply 1 application topically daily.  0  . Vitamin D, Ergocalciferol, (DRISDOL) 50000 units CAPS capsule Take 50,000 Units by mouth 2 (two) times a week.  0   No facility-administered medications prior to visit.    PAST MEDICAL HISTORY: Past Medical History  Diagnosis Date  . Obesity   . Migraine   . Fibromyalgia   . Mood disorder ([place])   . Breast mass 05/2014    left  . Complication of  anesthesia     states had a problem with her heart rate during UHR, and was hard to wake up post-op; could not be more specific, but was not referred to cardiologist  . Tachycardia     states heart rate has been high for last 3 doctor visits, has not been referred to cardiolgist or endocrinologist    PAST SURGICAL HISTORY: Past Surgical History  Procedure Laterality Date  . Tonsillectomy  1989  . Nasal turbinate reduction    . Elbow fracture surgery Right     age 87  . Umbilical hernia repair  2010  . Cholecystectomy  08/01/2010  . Dilation and curettage of uterus  01/06/2002  . Umbilical hernia repair  2010  . Breast lumpectomy with radioactive seed localization Left 05/15/2014    Procedure: LEFT BREAST LUMPECTOMY WITH RADIOACTIVE SEED LOCALIZATION;  Surgeon: Fanny Skates, MD;  Location: [facility];  Service: General;  Laterality: Left;    FAMILY HISTORY: Family History  Problem Relation Age of Onset  . Ovarian cancer Mother   . Migraines Mother   . Migraines Son   . Hypertension Mother     SOCIAL HISTORY:  Social History   Social History  . Marital Status: Married    Spouse Name: Legrand Como  . Number of Children: 3  . Years of Education: 14   Occupational History  . Stay at home    Social History Main Topics  . Smoking status: Never Smoker   . Smokeless tobacco: Never Used  . Alcohol Use: Yes     Comment: rarely  . Drug Use: No  . Sexual Activity: Yes    Birth Control/ Protection: IUD   Other Topics Concern  . Not on file   Social History Narrative   Lives at home with husband and 3 kids      Caffeine use: 1 cup daily        PHYSICAL EXAM  GENERAL EXAM/CONSTITUTIONAL: Vitals:  Filed Vitals:   08/02/15 0820 08/02/15 0825  BP: 133/86 130/81  Pulse: 125 120  Height: 5\' 4"  (1.626 m)   Weight: 202 lb 9.6 oz (91.899 kg)    Wt Readings from Last 3 Encounters:  08/02/15 202 lb 9.6 oz (91.899 kg)  06/21/15 203 lb 12.8 oz (92.443 kg)    05/15/14 199 lb (90.266 kg)    Body mass index is 34.76 kg/(m^2). No exam data present  Patient is in no distress; well developed, nourished and groomed; neck is supple  CARDIOVASCULAR:  Examination of carotid arteries is normal; no carotid bruits  Regular rate and rhythm, no murmurs  Examination of peripheral vascular system by observation and palpation is normal  EYES:  Ophthalmoscopic exam of optic discs and posterior segments is normal; no papilledema or hemorrhages  MUSCULOSKELETAL:  Gait, strength, tone, movements noted in Neurologic exam below  NEUROLOGIC: MENTAL STATUS:  No flowsheet data found.  awake, alert, oriented to person, place and time  recent and remote memory intact  normal attention and concentration  language fluent, comprehension intact, naming intact,   fund of knowledge appropriate  CRANIAL NERVE:   2nd - no papilledema on fundoscopic exam  2nd, 3rd, 4th, 6th - pupils equal and reactive to light, visual fields full to confrontation, extraocular muscles intact, no nystagmus  5th - facial sensation symmetric  7th - facial strength symmetric  8th - hearing intact  9th - palate elevates symmetrically, uvula midline  11th - shoulder shrug symmetric  12th - tongue protrusion midline  MOTOR:   normal bulk and tone, full strength in the BUE, BLE  SENSORY:   normal and symmetric to light touch, temperature, vibration  COORDINATION:   finger-nose-finger, fine finger movements normal  REFLEXES:   deep tendon reflexes present and symmetric  GAIT/STATION:   narrow based gait    DIAGNOSTIC DATA (LABS, IMAGING, TESTING) - I reviewed patient records, labs, notes, testing and imaging myself where available.  Lab Results  Component Value Date   WBC 6.6 05/14/2014   HGB 13.7 05/14/2014   HCT 40.3 05/14/2014   MCV 88.8 05/14/2014   PLT 276 05/14/2014      Component Value Date/Time   NA 137 05/14/2014 1100   K 4.3  05/14/2014 1100   CL 105 05/14/2014 1100   CO2 23 05/14/2014 1100   GLUCOSE 89 05/14/2014 1100   BUN 9 05/14/2014 1100   CREATININE 0.74 05/14/2014 1100   CALCIUM 9.0 05/14/2014 1100   PROT 7.4 05/14/2014 1100   ALBUMIN 4.4 05/14/2014 1100   AST 17 05/14/2014 1100   ALT 20 05/14/2014 1100   ALKPHOS 85 05/14/2014 1100   BILITOT 0.7 05/14/2014 1100   GFRNONAA >90 05/14/2014 1100   GFRAA >90 05/14/2014 1100   No results found for: CHOL, HDL, LDLCALC, LDLDIRECT, TRIG, CHOLHDL No results found for: HGBA1C No results found for: VITAMINB12 No results found for: TSH  04/08/12 xray lumbar [I reviewed images myself and agree with interpretation. -[facility]P]  - negative    ASSESSMENT AND PLAN  35 y.o. year old female here with 2-3 months migratory numbness, sometimes assoc with migraine but sometimes independent.  Ddx: migraine phenomenon vs secondary cause (CNS inflamm/autoimmune)  1. Migraine with aura and without status migrainosus, not intractable   2. Numbness   3. Tachycardia      PLAN: - follow up MRI brain (ordered, but patient worried about claustrophobia) - follow up sleep study ([facility]) - continue topiramate; increase to 50mg  in AM and 100mg  in PM - change sumatriptan to rizatriptan - follow up with PCP re: tachycardia; advised to drink more fluids and start gradual exercise program as well  Meds ordered this encounter  Medications  . ALPRAZolam (XANAX) 0.5 MG tablet    Sig: Take 1 tablet (0.5 mg total) by mouth as needed for anxiety (for sedation before MRI scan; take 1 hour before scan; may repeat 15 min before scan).    Dispense:  3 tablet    Refill:  0  . topiramate (TOPAMAX) 50 MG tablet    Sig: Take 1-2 tablets (50-100 mg total) by mouth 2 (two) times daily.    Dispense:  120 tablet    Refill:  12  . rizatriptan (MAXALT-MLT) 10 MG disintegrating tablet    Sig: Take 1 tablet (10 mg total) by mouth as needed for migraine. May repeat in 2 hours if needed      Dispense:  9 tablet    Refill:  11  . promethazine (PHENERGAN) 12.5 MG tablet    Sig: Take 1 tablet (12.5 mg total) by mouth every 8 (eight) hours as needed for nausea or vomiting.    Dispense:  30 tablet    Refill:  0   Return in about 3 months (around 11/01/2015).    Penni Bombard, MD [id number], [id number] AM Certified in Neurology, Neurophysiology and Neuroimaging  [facility] [address] [place], [place] [address] [phone]

## 2015-08-02 NOTE — Patient Instructions (Signed)
Thank you for coming to see [place] at [facility]. I hope we have been able to provide you high quality care today.  You may receive a patient satisfaction survey over the next few weeks. We would appreciate your feedback and comments so that we may continue to improve ourselves and the health of our patients.  - increase topiramate up to 50-172m twice day - change sumatriptan to rizatriptan   ~~~~~~~~~~~~~~~~~~~~~~~~~~~~~~~~~~~~~~~~~~~~~~~~~~~~~~~~~~~~~~~~~  DR. PENUMALLI'S GUIDE TO HAPPY AND HEALTHY LIVING These are some of my general health and wellness recommendations. Some of them may apply to you better than others. Please use common sense as you try these suggestions and feel free to ask me any questions.   ACTIVITY/FITNESS Mental, social, emotional and physical stimulation are very important for brain and body health. Try learning a new activity (arts, music, language, sports, games).  Keep moving your body to the best of your abilities. You can do this at home, inside or outside, the park, community center, gym or anywhere you like. Consider a physical therapist or personal trainer to get started. Consider the app Sworkit. Fitness trackers such as smart-watches, smart-phones or Fitbits can help as well.   NUTRITION Eat more plants: colorful vegetables, nuts, seeds and berries.  Eat less sugar, salt, preservatives and processed foods.  Avoid toxins such as cigarettes and alcohol.  Drink water when you are thirsty. Warm water with a slice of lemon is an excellent morning drink to start the day.  Consider these websites for more information The [employer] (hhttps://www.henry-hernandez.biz/ [employer] (wWindowBlog.ch   RELAXATION Consider practicing mindfulness meditation or other relaxation techniques such as deep breathing, prayer, yoga, tai chi, massage. See website mindful.org or the apps Headspace  or Calm to help get started.   SLEEP Try to get at least 7-8+ hours sleep per day. Regular exercise and reduced caffeine will help you sleep better. Practice good sleep hygeine techniques. See website sleep.org for more information.   PLANNING Prepare estate planning, living will, healthcare POA documents. Sometimes this is best planned with the help of an attorney. Theconversationproject.org and agingwithdignity.org are excellent resources.

## 2015-11-04 ENCOUNTER — Ambulatory Visit: Payer: Commercial Managed Care - PPO | Admitting: Diagnostic Neuroimaging

## 2015-11-04 ENCOUNTER — Telehealth: Payer: Self-pay | Admitting: *Deleted

## 2015-11-04 NOTE — Telephone Encounter (Signed)
?

## 2015-11-06 ENCOUNTER — Encounter: Payer: Self-pay | Admitting: Diagnostic Neuroimaging

## 2015-11-16 ENCOUNTER — Other Ambulatory Visit: Payer: Self-pay | Admitting: *Deleted

## 2015-11-19 ENCOUNTER — Other Ambulatory Visit: Payer: Self-pay | Admitting: Family Medicine

## 2015-11-19 DIAGNOSIS — R1031 Right lower quadrant pain: Secondary | ICD-10-CM

## 2015-11-25 ENCOUNTER — Ambulatory Visit
Admission: RE | Admit: 2015-11-25 | Discharge: 2015-11-25 | Disposition: A | Discharge status: Home / Self Care | Payer: Commercial Managed Care - PPO | Source: Ambulatory Visit | Attending: Family Medicine | Admitting: Family Medicine

## 2015-11-25 DIAGNOSIS — R1031 Right lower quadrant pain: Secondary | ICD-10-CM

## 2015-11-25 MED ORDER — IOPAMIDOL (ISOVUE-300) INJECTION 61%
100.0000 mL | Freq: Once | INTRAVENOUS | Status: AC | PRN
  Administered 2015-11-25: 100 mL via INTRAVENOUS

## 2016-06-21 ENCOUNTER — Other Ambulatory Visit: Payer: Self-pay | Admitting: Diagnostic Neuroimaging

## 2016-06-25 DIAGNOSIS — D1722 Benign lipomatous neoplasm of skin and subcutaneous tissue of left arm: Secondary | ICD-10-CM | POA: Diagnosis not present

## 2016-06-25 DIAGNOSIS — G43009 Migraine without aura, not intractable, without status migrainosus: Secondary | ICD-10-CM | POA: Diagnosis not present

## 2016-07-03 ENCOUNTER — Other Ambulatory Visit: Payer: Self-pay | Admitting: Diagnostic Neuroimaging

## 2016-09-24 DIAGNOSIS — G43909 Migraine, unspecified, not intractable, without status migrainosus: Secondary | ICD-10-CM | POA: Diagnosis not present

## 2016-10-22 DIAGNOSIS — M797 Fibromyalgia: Secondary | ICD-10-CM | POA: Diagnosis not present

## 2016-10-22 DIAGNOSIS — E559 Vitamin D deficiency, unspecified: Secondary | ICD-10-CM | POA: Diagnosis not present

## 2016-10-22 DIAGNOSIS — G43109 Migraine with aura, not intractable, without status migrainosus: Secondary | ICD-10-CM | POA: Diagnosis not present

## 2017-02-02 DIAGNOSIS — M797 Fibromyalgia: Secondary | ICD-10-CM | POA: Diagnosis not present

## 2017-02-02 DIAGNOSIS — G43109 Migraine with aura, not intractable, without status migrainosus: Secondary | ICD-10-CM | POA: Diagnosis not present

## 2017-02-02 DIAGNOSIS — E559 Vitamin D deficiency, unspecified: Secondary | ICD-10-CM | POA: Diagnosis not present

## 2017-03-08 DIAGNOSIS — J209 Acute bronchitis, unspecified: Secondary | ICD-10-CM | POA: Diagnosis not present

## 2017-05-21 DIAGNOSIS — E559 Vitamin D deficiency, unspecified: Secondary | ICD-10-CM | POA: Diagnosis not present

## 2017-05-21 DIAGNOSIS — N6310 Unspecified lump in the right breast, unspecified quadrant: Secondary | ICD-10-CM | POA: Diagnosis not present

## 2017-05-25 ENCOUNTER — Other Ambulatory Visit: Payer: Self-pay | Admitting: Family Medicine

## 2017-05-25 DIAGNOSIS — N6310 Unspecified lump in the right breast, unspecified quadrant: Secondary | ICD-10-CM

## 2017-06-01 ENCOUNTER — Ambulatory Visit
Admission: RE | Admit: 2017-06-01 | Discharge: 2017-06-01 | Disposition: A | Discharge status: Home / Self Care | Payer: Commercial Managed Care - PPO | Source: Ambulatory Visit | Attending: Family Medicine | Admitting: Family Medicine

## 2017-06-01 ENCOUNTER — Ambulatory Visit: Payer: Commercial Managed Care - PPO

## 2017-06-01 DIAGNOSIS — R928 Other abnormal and inconclusive findings on diagnostic imaging of breast: Secondary | ICD-10-CM | POA: Diagnosis not present

## 2017-06-01 DIAGNOSIS — N6310 Unspecified lump in the right breast, unspecified quadrant: Secondary | ICD-10-CM

## 2017-06-07 DIAGNOSIS — G500 Trigeminal neuralgia: Secondary | ICD-10-CM | POA: Diagnosis not present

## 2017-07-02 DIAGNOSIS — G43109 Migraine with aura, not intractable, without status migrainosus: Secondary | ICD-10-CM | POA: Diagnosis not present

## 2017-07-09 DIAGNOSIS — J069 Acute upper respiratory infection, unspecified: Secondary | ICD-10-CM | POA: Diagnosis not present

## 2017-08-03 ENCOUNTER — Ambulatory Visit (INDEPENDENT_AMBULATORY_CARE_PROVIDER_SITE_OTHER): Payer: Commercial Managed Care - PPO | Admitting: Diagnostic Neuroimaging

## 2017-08-03 ENCOUNTER — Encounter: Payer: Self-pay | Admitting: Diagnostic Neuroimaging

## 2017-08-03 VITALS — BP 125/78 | HR 103 | Ht 63.0 in | Wt 210.4 lb

## 2017-08-03 DIAGNOSIS — M5412 Radiculopathy, cervical region: Secondary | ICD-10-CM | POA: Diagnosis not present

## 2017-08-03 DIAGNOSIS — R51 Headache: Secondary | ICD-10-CM | POA: Diagnosis not present

## 2017-08-03 DIAGNOSIS — G43109 Migraine with aura, not intractable, without status migrainosus: Secondary | ICD-10-CM | POA: Insufficient documentation

## 2017-08-03 DIAGNOSIS — R519 Headache, unspecified: Secondary | ICD-10-CM

## 2017-08-03 DIAGNOSIS — G44099 Other trigeminal autonomic cephalgias (TAC), not intractable: Secondary | ICD-10-CM

## 2017-08-03 MED ORDER — RIZATRIPTAN BENZOATE 10 MG PO TBDP: 10.0000 mg | ORAL_TABLET | ORAL | 11 refills | Status: DC | PRN

## 2017-08-03 MED ORDER — TOPIRAMATE 50 MG PO TABS: 50.0000 mg | ORAL_TABLET | Freq: Two times a day (BID) | ORAL | 12 refills | Status: DC

## 2017-08-03 MED ORDER — ALPRAZOLAM 0.5 MG PO TABS: 0.5000 mg | ORAL_TABLET | ORAL | 0 refills | Status: DC | PRN

## 2017-08-03 NOTE — Patient Instructions (Signed)
RIGHT FACE / NECK [place] Masker PAIN - check MRI brain / cervical spine  MIGRAINE WITH AURA - increase topiramate --> increase to 50mg  in AM and 100mg  in PM x 2 weeks, then up to 100mg  twice a day  - continue rizatriptan  INSOMNIA / ANXIETY - sleep hygiene reviewed - consider psychology / psychiatry evaluation for anxiety disorder

## 2017-08-03 NOTE — Progress Notes (Signed)
[facility]  PATIENT: Debra Hensley DOB: May 20, 1980  REFERRING CLINICIAN: Cipriano Mile  HISTORY FROM: patient  REASON FOR VISIT: follow up   HISTORICAL  CHIEF COMPLAINT:  Chief Complaint  Patient presents with  . Migraine    rm 7, "new problem- pain in right side of neck, face which is excrutiating"     HISTORY OF PRESENT ILLNESS:   UPDATE (08/03/17, VRP): Since last visit, patient continues to have migraines approximately 1-2/month.  However patient has also developed a different kind of pain problem for past 3 years, worse in the last 6-12 months, with sudden sharp pain in her right shoulder radiating to her right neck, right face and right eye.  Sometimes pain radiates down her right arm and has some numbness and tingling.  Sometimes has photophobia and phonophobia.  Pain is very severe and can last all day.  Patient also has some scalp sensitivity with this problem.    Patient continues to have significant sleep disturbance, averaging 3-4 hours of sleep per night.  This is related to insomnia and anxiety feelings as well as crowded bedroom where most nights 2 children and their family dog sleep in their bed.  UPDATE 08/02/15: Since last visit, now on TPX + sumatriptan. Still 1 HA every other week. Last week had migraine lasting 4 days. No triggers noted for HA. Int numbness still occuring.   PRIOR HPI 06/21/15: 37 year old right-handed female here for evaluation of numbness and tingling. Symptoms have been present for past 2-3 months. She describes migratory right greater than left side, arms, hands, legs and feet numbness and tingling sensation. Symptoms last minutes at a time can occur multiple times a day. She has these sensations approximately 10 days per month. Symptoms are particularly noticeable during migraine headache attacks. Patient has had migraine headaches since age 59 years old. She has a warning of scintillating visual phenomenon, followed by severe  global headache with neck pain, shoulder pain, nausea, photophobia and phonophobia. When she was younger she would pass out from severe headaches. For most of her life she was having 1 migraine headache every few months. Over past few years this has increased to at least one migraine attack per month, which can last at least 2 days. In the last few months she has been averaging 2-4 days of headache per month. She's never been on preventative medication. She has been on Imitrex 100 mg as needed for rescue medication. This seems to work well. She also takes [phone] mg of Tylenol every day for past few months. Patient also has history of depression, anxiety and fibromyalgia. She is on Cymbalta, gabapentin and Vicodin as needed. No other specific triggering factors, change in stress, diet, exercise, sleep. She does have chronic fatigue in spite of adequate rest at nighttime. She had a sleep study a few years ago which apparently was negative for sleep apnea although she did not sleep well during the study.   REVIEW OF SYSTEMS: Full 14 system review of systems performed and negative except: headaches.   ALLERGIES: Allergies  Allergen Reactions  . Adhesive [Tape] Other (See Comments)    BLISTERS  . Hydrocodone Itching  . Codeine Itching  . Percocet [Oxycodone-Acetaminophen] Itching    HOME MEDICATIONS: Outpatient Medications Prior to Visit  Medication Sig Dispense Refill  . ALPRAZolam (XANAX) 0.5 MG tablet Take 1 tablet (0.5 mg total) by mouth as needed for anxiety (for sedation before MRI scan; take 1 hour before scan; may repeat 15 min  before scan). 3 tablet 0  . buPROPion (WELLBUTRIN XL) 150 MG 24 hr tablet TAKE 1 TABLET BY MOUTH EVERY DAY IN THE MORNING  1  . cyclobenzaprine (FLEXERIL) 10 MG tablet Take 10 mg by mouth as needed for muscle spasms.     . DULoxetine (CYMBALTA) 60 MG capsule Take 60 mg by mouth daily.  5  . promethazine (PHENERGAN) 12.5 MG tablet Take 1 tablet (12.5 mg total) by  mouth every 8 (eight) hours as needed for nausea or vomiting. 30 tablet 0  . rizatriptan (MAXALT-MLT) 10 MG disintegrating tablet Take 1 tablet (10 mg total) by mouth as needed for migraine. May repeat in 2 hours if needed 9 tablet 11  . SUMAtriptan (IMITREX) 100 MG tablet 1 TAB AT TIME OF MIGRAINE AND REPEAT 2 HOURS LATER IF NEEDED AS NEEDED ORALLY 90  1  . topiramate (TOPAMAX) 50 MG tablet Take 1-2 tablets (50-100 mg total) by mouth 2 (two) times daily. 120 tablet 12  . Vitamin D, Ergocalciferol, (DRISDOL) 50000 units CAPS capsule Take 50,000 Units by mouth 2 (two) times a week.  0  . triamcinolone cream (KENALOG) 0.5 % Apply 1 application topically daily.  0   No facility-administered medications prior to visit.     PAST MEDICAL HISTORY: Past Medical History:  Diagnosis Date  . Breast mass 05/2014   left  . Complication of anesthesia    states had a problem with her heart rate during UHR, and was hard to wake up post-op; could not be more specific, but was not referred to cardiologist  . Fibromyalgia   . Migraine   . Mood disorder ([place])   . Obesity   . Tachycardia    states heart rate has been high for last 3 doctor visits, has not been referred to cardiolgist or endocrinologist    PAST SURGICAL HISTORY: Past Surgical History:  Procedure Laterality Date  . BREAST EXCISIONAL BIOPSY Left    2015  . BREAST LUMPECTOMY WITH RADIOACTIVE SEED LOCALIZATION Left 05/15/2014   Procedure: LEFT BREAST LUMPECTOMY WITH RADIOACTIVE SEED LOCALIZATION;  Surgeon: Fanny Skates, MD;  Location: [facility];  Service: General;  Laterality: Left;  . CHOLECYSTECTOMY  08/01/2010  . DILATION AND CURETTAGE OF UTERUS  01/06/2002  . ELBOW FRACTURE SURGERY Right    age 72  . NASAL TURBINATE REDUCTION    . TONSILLECTOMY  1989  . UMBILICAL HERNIA REPAIR  2010  . UMBILICAL HERNIA REPAIR  2010    FAMILY HISTORY: Family History  Problem Relation Age of Onset  . Ovarian cancer Mother   .  Migraines Mother   . Hypertension Mother   . Migraines Son     SOCIAL HISTORY:  Social History   Socioeconomic History  . Marital status: Married    Spouse name: Legrand Como  . Number of children: 3  . Years of education: 65  . Highest education level: Not on file  Occupational History  . Occupation: Stay at home  Social Needs  . Financial resource strain: Not on file  . Food insecurity:    Worry: Not on file    Inability: Not on file  . Transportation needs:    Medical: Not on file    Non-medical: Not on file  Tobacco Use  . Smoking status: Never Smoker  . Smokeless tobacco: Never Used  Substance and Sexual Activity  . Alcohol use: Yes    Comment: rarely  . Drug use: No  . Sexual activity: Yes  Birth control/protection: IUD  Lifestyle  . Physical activity:    Days per week: Not on file    Minutes per session: Not on file  . Stress: Not on file  Relationships  . Social connections:    Talks on phone: Not on file    Gets together: Not on file    Attends religious service: Not on file    Active member of club or organization: Not on file    Attends meetings of clubs or organizations: Not on file    Relationship status: Not on file  . Intimate partner violence:    Fear of current or ex partner: Not on file    Emotionally abused: Not on file    Physically abused: Not on file    Forced sexual activity: Not on file  Other Topics Concern  . Not on file  Social History Narrative   Lives at home with husband and 3 kids   Education -    Caffeine use: 1 cup daily     PHYSICAL EXAM  GENERAL EXAM/CONSTITUTIONAL: Vitals:  Vitals:   08/03/17 0835  BP: 125/78  Pulse: (!) 103  Weight: 210 lb 6.4 oz (95.4 kg)  Height: 5\' 3"  (1.6 m)   Wt Readings from Last 3 Encounters:  08/03/17 210 lb 6.4 oz (95.4 kg)  08/02/15 202 lb 9.6 oz (91.9 kg)  06/21/15 203 lb 12.8 oz (92.4 kg)    Body mass index is 37.27 kg/m. No exam data present  Patient is in no distress;  well developed, nourished and groomed; neck is supple  CARDIOVASCULAR:  Examination of carotid arteries is normal; no carotid bruits  Regular rate and rhythm, no murmurs  Examination of peripheral vascular system by observation and palpation is normal  EYES:  Ophthalmoscopic exam of optic discs and posterior segments is normal; no papilledema or hemorrhages  MUSCULOSKELETAL:  Gait, strength, tone, movements noted in Neurologic exam below  NEUROLOGIC: MENTAL STATUS:  No flowsheet data found.  awake, alert, oriented to person, place and time  recent and remote memory intact  normal attention and concentration  language fluent, comprehension intact, naming intact,   fund of knowledge appropriate  CRANIAL NERVE:   2nd - no papilledema on fundoscopic exam  2nd, 3rd, 4th, 6th - pupils equal and reactive to light, visual fields full to confrontation, extraocular muscles intact, no nystagmus  5th - facial sensation symmetric  7th - facial strength symmetric  8th - hearing intact  9th - palate elevates symmetrically, uvula midline  11th - shoulder shrug symmetric  12th - tongue protrusion midline  MOTOR:   normal bulk and tone, full strength in the BUE, BLE  SENSORY:   normal and symmetric to light touch, temperature, vibration  COORDINATION:   finger-nose-finger, fine finger movements normal  REFLEXES:   deep tendon reflexes present and symmetric  GAIT/STATION:   narrow based gait    DIAGNOSTIC DATA (LABS, IMAGING, TESTING) - I reviewed patient records, labs, notes, testing and imaging myself where available.  Lab Results  Component Value Date   WBC 6.6 05/14/2014   HGB 13.7 05/14/2014   HCT 40.3 05/14/2014   MCV 88.8 05/14/2014   PLT 276 05/14/2014      Component Value Date/Time   NA 137 05/14/2014 1100   K 4.3 05/14/2014 1100   CL 105 05/14/2014 1100   CO2 23 05/14/2014 1100   GLUCOSE 89 05/14/2014 1100   BUN 9 05/14/2014 1100    CREATININE 0.74 05/14/2014 1100  CALCIUM 9.0 05/14/2014 1100   PROT 7.4 05/14/2014 1100   ALBUMIN 4.4 05/14/2014 1100   AST 17 05/14/2014 1100   ALT 20 05/14/2014 1100   ALKPHOS 85 05/14/2014 1100   BILITOT 0.7 05/14/2014 1100   GFRNONAA >90 05/14/2014 1100   GFRAA >90 05/14/2014 1100   No results found for: CHOL, HDL, LDLCALC, LDLDIRECT, TRIG, CHOLHDL No results found for: HGBA1C No results found for: VITAMINB12 No results found for: TSH  04/08/12 xray lumbar [I reviewed images myself and agree with interpretation. -VRP]  - negative    ASSESSMENT AND PLAN  37 y.o. year old female here with 2-3 months migratory numbness, sometimes assoc with migraine but sometimes independent. Also with worsening right facial pain, eye pain, shoulder and arm pain / numbness.   Ddx: migraine phenomenon vs secondary cause (CNS inflamm/autoimmune)  1. Cervical radiculopathy   2. Other trigeminal autonomic cephalgia (TAC), not intractable   3. Migraine with aura and without status migrainosus, not intractable   4. Right facial pain      PLAN:  I spent 45 minutes of face to face time with patient. Greater than 50% of time was spent in counseling and coordination of care with patient. In summary we discussed:   RIGHT FACE / NECK /SHOULDER PAIN - check MRI brain / cervical spine to rule out demyelinating disease  MIGRAINE WITH AURA - increase topiramate --> increase to 50mg  in AM and 100mg  in PM x 2 weeks, then up to 100mg  twice a day  - continue rizatriptan  INSOMNIA / ANXIETY - sleep hygiene reviewed - consider psychology / psychiatry evaluation for anxiety disorder  Orders Placed This Encounter  Procedures  . MR BRAIN W WO CONTRAST  . MR CERVICAL SPINE W WO CONTRAST   Meds ordered this encounter  Medications  . ALPRAZolam (XANAX) 0.5 MG tablet    Sig: Take 1 tablet (0.5 mg total) by mouth as needed for anxiety (for sedation before MRI scan; take 1 hour before scan; may repeat 15  min before scan).    Dispense:  3 tablet    Refill:  0  . topiramate (TOPAMAX) 50 MG tablet    Sig: Take 1-2 tablets (50-100 mg total) by mouth 2 (two) times daily.    Dispense:  120 tablet    Refill:  12  . rizatriptan (MAXALT-MLT) 10 MG disintegrating tablet    Sig: Take 1 tablet (10 mg total) by mouth as needed for migraine. May repeat in 2 hours if needed    Dispense:  9 tablet    Refill:  11   Return in about 6 months (around 02/02/2018).  I reviewed images, labs, notes, records myself. I summarized findings and reviewed with patient, for this high risk condition (right facial pain, right arm numbness) requiring high complexity decision making.    Penni Bombard, MD 05/09/1094, 0:45 AM Certified in Neurology, Neurophysiology and Neuroimaging  [facility] [address], [place], [place] [address] [phone]

## 2017-08-04 ENCOUNTER — Telehealth: Payer: Self-pay | Admitting: Diagnostic Neuroimaging

## 2017-08-04 NOTE — Telephone Encounter (Signed)
[facility] Auth: [id number] (exp. 08/03/17 to 09/01/17) lvm for pt to call back about scheduling mri.

## 2017-11-08 DIAGNOSIS — E559 Vitamin D deficiency, unspecified: Secondary | ICD-10-CM | POA: Diagnosis not present

## 2017-11-08 DIAGNOSIS — R631 Polydipsia: Secondary | ICD-10-CM | POA: Diagnosis not present

## 2017-11-08 DIAGNOSIS — Z131 Encounter for screening for diabetes mellitus: Secondary | ICD-10-CM | POA: Diagnosis not present

## 2017-11-15 DIAGNOSIS — M542 Cervicalgia: Secondary | ICD-10-CM | POA: Diagnosis not present

## 2017-11-15 DIAGNOSIS — M545 Low back pain: Secondary | ICD-10-CM | POA: Diagnosis not present

## 2017-11-24 DIAGNOSIS — M545 Low back pain: Secondary | ICD-10-CM | POA: Diagnosis not present

## 2017-11-24 DIAGNOSIS — M5412 Radiculopathy, cervical region: Secondary | ICD-10-CM | POA: Diagnosis not present

## 2017-12-06 DIAGNOSIS — M797 Fibromyalgia: Secondary | ICD-10-CM | POA: Diagnosis not present

## 2018-01-17 DIAGNOSIS — L259 Unspecified contact dermatitis, unspecified cause: Secondary | ICD-10-CM | POA: Diagnosis not present

## 2018-01-17 DIAGNOSIS — L739 Follicular disorder, unspecified: Secondary | ICD-10-CM | POA: Diagnosis not present

## 2018-01-19 ENCOUNTER — Telehealth: Payer: Self-pay | Admitting: Diagnostic Neuroimaging

## 2018-01-19 MED ORDER — ALPRAZOLAM 0.5 MG PO TABS: 0.5000 mg | ORAL_TABLET | ORAL | 0 refills | Status: DC | PRN

## 2018-01-19 NOTE — Telephone Encounter (Signed)
Patient is wanting to r/s her MRi's from [facility]. I have her scheduled for 01/25/18 for [facility]. [facility] Auth: [id number] (exp. 01/19/18 to 02/17/18)   Patient also informed me she is slightly claustrophobic and would like something to help her nerves.

## 2018-01-19 NOTE — Telephone Encounter (Signed)
Fax confirmation received [facility] [address]  [phone].

## 2018-01-19 NOTE — Telephone Encounter (Signed)
Meds ordered this encounter  Medications  . ALPRAZolam (XANAX) 0.5 MG tablet    Sig: Take 1 tablet (0.5 mg total) by mouth as needed for anxiety (for sedation before MRI scan; take 1 hour before scan; may repeat 15 min before scan).    Dispense:  3 tablet    Refill:  0   Penni Bombard, MD 9/79/1504, 1:36 PM Certified in Neurology, Neurophysiology and Neuroimaging  [facility] [address], [place], [place] [address] [phone]

## 2018-01-25 ENCOUNTER — Ambulatory Visit (INDEPENDENT_AMBULATORY_CARE_PROVIDER_SITE_OTHER): Payer: Commercial Managed Care - PPO

## 2018-01-25 DIAGNOSIS — K121 Other forms of stomatitis: Secondary | ICD-10-CM | POA: Diagnosis not present

## 2018-01-25 DIAGNOSIS — L739 Follicular disorder, unspecified: Secondary | ICD-10-CM | POA: Diagnosis not present

## 2018-01-25 DIAGNOSIS — R21 Rash and other nonspecific skin eruption: Secondary | ICD-10-CM | POA: Diagnosis not present

## 2018-01-25 NOTE — Telephone Encounter (Signed)
LMVM for pt to return call.  ? How many xanax she used and what is left of prescription.

## 2018-01-25 NOTE — Telephone Encounter (Signed)
Patient did not have the MRI's today on the mobile unit.Marland Kitchen Lennette Bihari informed me that it is not working and I tried to reschedule with her but she wants to go to [facility]. She will contact [facility] to schedule.. She did take an Xanax but she didn't know if it would be okay to have another one while she has the MRI at [facility]?   I called [facility] and switched the auth location and got a new expire date until 03/03/18.

## 2018-01-26 ENCOUNTER — Other Ambulatory Visit: Payer: Self-pay | Admitting: *Deleted

## 2018-01-26 DIAGNOSIS — R200 Anesthesia of skin: Secondary | ICD-10-CM

## 2018-01-26 NOTE — Telephone Encounter (Signed)
[facility] informed me they need new orders for the MRI.

## 2018-01-26 NOTE — Telephone Encounter (Signed)
Done

## 2018-02-05 ENCOUNTER — Ambulatory Visit
Admission: RE | Admit: 2018-02-05 | Discharge: 2018-02-05 | Disposition: A | Discharge status: Home / Self Care | Payer: Commercial Managed Care - PPO | Source: Ambulatory Visit | Attending: Diagnostic Neuroimaging | Admitting: Diagnostic Neuroimaging

## 2018-02-05 DIAGNOSIS — R200 Anesthesia of skin: Secondary | ICD-10-CM

## 2018-02-13 ENCOUNTER — Ambulatory Visit
Admission: RE | Admit: 2018-02-13 | Discharge: 2018-02-13 | Disposition: A | Discharge status: Home / Self Care | Payer: Commercial Managed Care - PPO | Source: Ambulatory Visit | Attending: Diagnostic Neuroimaging | Admitting: Diagnostic Neuroimaging

## 2018-02-13 DIAGNOSIS — R200 Anesthesia of skin: Secondary | ICD-10-CM | POA: Diagnosis not present

## 2018-02-13 MED ORDER — GADOBENATE DIMEGLUMINE 529 MG/ML IV SOLN
20.0000 mL | Freq: Once | INTRAVENOUS | Status: AC | PRN
  Administered 2018-02-13: 20 mL via INTRAVENOUS

## 2018-02-15 ENCOUNTER — Ambulatory Visit (INDEPENDENT_AMBULATORY_CARE_PROVIDER_SITE_OTHER): Payer: Commercial Managed Care - PPO | Admitting: Diagnostic Neuroimaging

## 2018-02-15 ENCOUNTER — Encounter: Payer: Self-pay | Admitting: Diagnostic Neuroimaging

## 2018-02-15 ENCOUNTER — Telehealth: Payer: Self-pay | Admitting: *Deleted

## 2018-02-15 VITALS — BP 118/84 | HR 105 | Wt 212.0 lb

## 2018-02-15 DIAGNOSIS — R519 Headache, unspecified: Secondary | ICD-10-CM

## 2018-02-15 DIAGNOSIS — G44099 Other trigeminal autonomic cephalgias (TAC), not intractable: Secondary | ICD-10-CM | POA: Diagnosis not present

## 2018-02-15 DIAGNOSIS — G43109 Migraine with aura, not intractable, without status migrainosus: Secondary | ICD-10-CM | POA: Diagnosis not present

## 2018-02-15 DIAGNOSIS — R51 Headache: Secondary | ICD-10-CM | POA: Diagnosis not present

## 2018-02-15 MED ORDER — CARBAMAZEPINE 200 MG PO TABS: 200.0000 mg | ORAL_TABLET | Freq: Two times a day (BID) | ORAL | 6 refills | Status: DC

## 2018-02-15 MED ORDER — RIZATRIPTAN BENZOATE 10 MG PO TBDP: 10.0000 mg | ORAL_TABLET | ORAL | 11 refills | Status: DC | PRN

## 2018-02-15 MED ORDER — TOPIRAMATE 50 MG PO TABS: 50.0000 mg | ORAL_TABLET | Freq: Two times a day (BID) | ORAL | 12 refills | Status: DC

## 2018-02-15 NOTE — Patient Instructions (Signed)
RIGHT FACE PAIN (? trigeminal neuralgia) - start carbamazepine 200mg  twice a day  RIGHT NECK /SHOULDER PAIN - conservative mgmt; cymbalta  MIGRAINE WITH AURA - increase topiramate to 50mg  twice a day; then increase up to 100mg  twice a day  - continue rizatriptan as needed  INSOMNIA / ANXIETY - sleep hygiene reviewed - consider psychology / psychiatry evaluation for anxiety disorder

## 2018-02-15 NOTE — Telephone Encounter (Signed)
Spoke with patient and informed her that her MRI brain and MRI cervical spine results are unremarkable which is good news. Reviewed Dr Farrel Gordon plan from last office note. She had not increased topiramate as he'd advised. She has begun counseling for anxiety disorder. She has a follow up today with Dr Leta Baptist and will discuss possible trigeminal neuralgia per patient. She verbalized understanding, appreciation for call.

## 2018-02-15 NOTE — Progress Notes (Signed)
[facility]  PATIENT: Debra Hensley DOB: Aug 08, 1980  REFERRING CLINICIAN: Cipriano Mile  HISTORY FROM: patient  REASON FOR VISIT: follow up   HISTORICAL  CHIEF COMPLAINT:  Chief Complaint  Patient presents with  . Cervical radiculopathy    rm 6, " face pain is excruciating when it hits me-worse pain of my life; my neck pain is probably part of my fibromyalgia- am changing my diet and seeing holistic provider"  . Follow-up    6 month    HISTORY OF PRESENT ILLNESS:   UPDATE (02/15/18, VRP): Since last visit, doing poorly. Symptoms are worsening and severe. Avg 1-2 migreaine per month and rizatriptan helps. Right facial pain worsening, constant, severe. No alleviating or aggravating factors.    UPDATE (08/03/17, VRP): Since last visit, patient continues to have migraines approximately 1-2/month.  However patient has also developed a different kind of pain problem for past 3 years, worse in the last 6-12 months, with sudden sharp pain in her right shoulder radiating to her right neck, right face and right eye.  Sometimes pain radiates down her right arm and has some numbness and tingling.  Sometimes has photophobia and phonophobia.  Pain is very severe and can last all day.  Patient also has some scalp sensitivity with this problem.    Patient continues to have significant sleep disturbance, averaging 3-4 hours of sleep per night.  This is related to insomnia and anxiety feelings as well as crowded bedroom where most nights 2 children and their family dog sleep in their bed.  UPDATE 08/02/15: Since last visit, now on TPX + sumatriptan. Still 1 HA every other week. Last week had migraine lasting 4 days. No triggers noted for HA. Int numbness still occuring.   PRIOR HPI 06/21/15: 37 year old right-handed female here for evaluation of numbness and tingling. Symptoms have been present for past 2-3 months. She describes migratory right greater than left side, arms, hands, legs  and feet numbness and tingling sensation. Symptoms last minutes at a time can occur multiple times a day. She has these sensations approximately 10 days per month. Symptoms are particularly noticeable during migraine headache attacks. Patient has had migraine headaches since age 47 years old. She has a warning of scintillating visual phenomenon, followed by severe global headache with neck pain, shoulder pain, nausea, photophobia and phonophobia. When she was younger she would pass out from severe headaches. For most of her life she was having 1 migraine headache every few months. Over past few years this has increased to at least one migraine attack per month, which can last at least 2 days. In the last few months she has been averaging 2-4 days of headache per month. She's never been on preventative medication. She has been on Imitrex 100 mg as needed for rescue medication. This seems to work well. She also takes [phone] mg of Tylenol every day for past few months. Patient also has history of depression, anxiety and fibromyalgia. She is on Cymbalta, gabapentin and Vicodin as needed. No other specific triggering factors, change in stress, diet, exercise, sleep. She does have chronic fatigue in spite of adequate rest at nighttime. She had a sleep study a few years ago which apparently was negative for sleep apnea although she did not sleep well during the study.  REVIEW OF SYSTEMS: Full 14 system review of systems performed and negative except: depression aching muscles eye pain insomnia headache numbness.  ALLERGIES: Allergies  Allergen Reactions  . Adhesive [Tape] Other (See  Comments)    BLISTERS  . Hydrocodone Itching  . Codeine Itching  . Percocet [Oxycodone-Acetaminophen] Itching    HOME MEDICATIONS: Outpatient Medications Prior to Visit  Medication Sig Dispense Refill  . ALPRAZolam (XANAX) 0.5 MG tablet Take 1 tablet (0.5 mg total) by mouth as needed for anxiety (for sedation before MRI scan;  take 1 hour before scan; may repeat 15 min before scan). 3 tablet 0  . ARIPiprazole (ABILIFY) 10 MG tablet TAKE 1 2 (ONE HALF) TABLET BY MOUTH ONCE DAILY FOR 4 DAYS THEN 1 TABLET FROM DAY FIVE DAILY  0  . buPROPion (WELLBUTRIN XL) 150 MG 24 hr tablet TAKE 1 TABLET BY MOUTH EVERY DAY IN THE MORNING  1  . cyclobenzaprine (FLEXERIL) 10 MG tablet Take 10 mg by mouth as needed for muscle spasms.     . DULoxetine (CYMBALTA) 60 MG capsule Take 60 mg by mouth daily.  5  . gabapentin (NEURONTIN) 300 MG capsule 300 mg daily.    . naproxen sodium (ANAPROX) 550 MG tablet naproxen sodium 550 mg tablet  TAKE ONE TABLET BY MOUTH TWICE DAILY AS NEEDED FOR PAIN    . promethazine (PHENERGAN) 12.5 MG tablet Take 1 tablet (12.5 mg total) by mouth every 8 (eight) hours as needed for nausea or vomiting. 30 tablet 0  . rizatriptan (MAXALT-MLT) 10 MG disintegrating tablet Take 1 tablet (10 mg total) by mouth as needed for migraine. May repeat in 2 hours if needed 9 tablet 11  . SUMAtriptan (IMITREX) 100 MG tablet sumatriptan 100 mg tablet  TAKE 1 TAB BY MOUTH ONCE AS NEEDED FOR MIRGRAINE. MAY REPEAT IN 2 HOURS. MAX 2 A DAY/8 A MONTH    . topiramate (TOPAMAX) 50 MG tablet Take 1-2 tablets (50-100 mg total) by mouth 2 (two) times daily. 120 tablet 12  . Vitamin D, Ergocalciferol, (DRISDOL) 50000 units CAPS capsule Take 50,000 Units by mouth 2 (two) times a week.  0   No facility-administered medications prior to visit.     PAST MEDICAL HISTORY: Past Medical History:  Diagnosis Date  . Breast mass 05/2014   left  . Complication of anesthesia    states had a problem with her heart rate during UHR, and was hard to wake up post-op; could not be more specific, but was not referred to cardiologist  . Fibromyalgia   . Migraine   . Mood disorder ([place])   . Obesity   . Tachycardia    states heart rate has been high for last 3 doctor visits, has not been referred to cardiolgist or endocrinologist    PAST SURGICAL  HISTORY: Past Surgical History:  Procedure Laterality Date  . BREAST EXCISIONAL BIOPSY Left    2015  . BREAST LUMPECTOMY WITH RADIOACTIVE SEED LOCALIZATION Left 05/15/2014   Procedure: LEFT BREAST LUMPECTOMY WITH RADIOACTIVE SEED LOCALIZATION;  Surgeon: Fanny Skates, MD;  Location: [facility];  Service: General;  Laterality: Left;  . CHOLECYSTECTOMY  08/01/2010  . DILATION AND CURETTAGE OF UTERUS  01/06/2002  . ELBOW FRACTURE SURGERY Right    age 54  . NASAL TURBINATE REDUCTION    . TONSILLECTOMY  1989  . UMBILICAL HERNIA REPAIR  2010  . UMBILICAL HERNIA REPAIR  2010    FAMILY HISTORY: Family History  Problem Relation Age of Onset  . Ovarian cancer Mother   . Migraines Mother   . Hypertension Mother   . Migraines Son     SOCIAL HISTORY:  Social History   Socioeconomic History  .  Marital status: Married    Spouse name: Legrand Como  . Number of children: 3  . Years of education: 30  . Highest education level: Not on file  Occupational History  . Occupation: Stay at home  Social Needs  . Financial resource strain: Not on file  . Food insecurity:    Worry: Not on file    Inability: Not on file  . Transportation needs:    Medical: Not on file    Non-medical: Not on file  Tobacco Use  . Smoking status: Never Smoker  . Smokeless tobacco: Never Used  Substance and Sexual Activity  . Alcohol use: Yes    Comment: rarely  . Drug use: No  . Sexual activity: Yes    Birth control/protection: IUD  Lifestyle  . Physical activity:    Days per week: Not on file    Minutes per session: Not on file  . Stress: Not on file  Relationships  . Social connections:    Talks on phone: Not on file    Gets together: Not on file    Attends religious service: Not on file    Active member of club or organization: Not on file    Attends meetings of clubs or organizations: Not on file    Relationship status: Not on file  . Intimate partner violence:    Fear of current or ex  partner: Not on file    Emotionally abused: Not on file    Physically abused: Not on file    Forced sexual activity: Not on file  Other Topics Concern  . Not on file  Social History Narrative   Lives at home with husband and 3 kids   Education -    Caffeine use: 1 cup daily    PHYSICAL EXAM  GENERAL EXAM/CONSTITUTIONAL: Vitals:  Vitals:   02/15/18 0904  BP: 118/84  Pulse: (!) 105  Weight: 212 lb (96.2 kg)   Wt Readings from Last 3 Encounters:  02/15/18 212 lb (96.2 kg)  08/03/17 210 lb 6.4 oz (95.4 kg)  08/02/15 202 lb 9.6 oz (91.9 kg)    Body mass index is 37.55 kg/m. No exam data present  Patient is in MILD DISTRESS DUE TO RIGHT FACIAL PAIN; well developed, nourished and groomed; neck is supple  CARDIOVASCULAR:  Examination of carotid arteries is normal; no carotid bruits  Regular rate and rhythm, no murmurs  Examination of peripheral vascular system by observation and palpation is normal  EYES:  Ophthalmoscopic exam of optic discs and posterior segments is normal; no papilledema or hemorrhages  MUSCULOSKELETAL:  Gait, strength, tone, movements noted in Neurologic exam below  NEUROLOGIC: MENTAL STATUS:  No flowsheet data found.  awake, alert, oriented to person, place and time  recent and remote memory intact  normal attention and concentration  language fluent, comprehension intact, naming intact,   fund of knowledge appropriate  CRANIAL NERVE:   2nd - no papilledema on fundoscopic exam  2nd, 3rd, 4th, 6th - pupils equal and reactive to light, visual fields full to confrontation, extraocular muscles intact, no nystagmus  5th - facial sensation symmetric  7th - facial strength symmetric  8th - hearing intact  9th - palate elevates symmetrically, uvula midline  11th - shoulder shrug symmetric  12th - tongue protrusion midline  MOTOR:   normal bulk and tone, full strength in the BUE, BLE  SENSORY:   normal and symmetric to light  touch  COORDINATION:   finger-nose-finger, fine finger movements normal  REFLEXES:   deep tendon reflexes present and symmetric  GAIT/STATION:   narrow based gait    DIAGNOSTIC DATA (LABS, IMAGING, TESTING) - I reviewed patient records, labs, notes, testing and imaging myself where available.  Lab Results  Component Value Date   WBC 6.6 05/14/2014   HGB 13.7 05/14/2014   HCT 40.3 05/14/2014   MCV 88.8 05/14/2014   PLT 276 05/14/2014      Component Value Date/Time   NA 137 05/14/2014 1100   K 4.3 05/14/2014 1100   CL 105 05/14/2014 1100   CO2 23 05/14/2014 1100   GLUCOSE 89 05/14/2014 1100   BUN 9 05/14/2014 1100   CREATININE 0.74 05/14/2014 1100   CALCIUM 9.0 05/14/2014 1100   PROT 7.4 05/14/2014 1100   ALBUMIN 4.4 05/14/2014 1100   AST 17 05/14/2014 1100   ALT 20 05/14/2014 1100   ALKPHOS 85 05/14/2014 1100   BILITOT 0.7 05/14/2014 1100   GFRNONAA >90 05/14/2014 1100   GFRAA >90 05/14/2014 1100   No results found for: CHOL, HDL, LDLCALC, LDLDIRECT, TRIG, CHOLHDL No results found for: HGBA1C No results found for: VITAMINB12 No results found for: TSH  04/08/12 xray lumbar [I reviewed images myself and agree with interpretation. -VRP]  - negative  02/13/18 MRI brain and cervical [I reviewed images myself and agree with interpretation. Except small right thalamic non-specific gliosis.  -VRP]  - unremarkable    ASSESSMENT AND PLAN  37 y.o. year old female here with 2-3 months migratory numbness, sometimes assoc with migraine but sometimes independent. Also with worsening right facial pain, eye pain, shoulder and arm pain / numbness.   Dx: migraine phenomenon + idiopathic trigeminal neuralgia + fibromyalgia  1. Migraine with aura and without status migrainosus, not intractable   2. Other trigeminal autonomic cephalgia (TAC), not intractable   3. Right facial pain      PLAN:  RIGHT FACE PAIN (? idiopathic trigeminal neuralgia) - start carbamazepine  200mg  twice a day  RIGHT NECK /SHOULDER PAIN - conservative mgmt; cymbalta  MIGRAINE WITH AURA - increase topiramate to 50mg  twice a day; then increase up to 100mg  twice a day  - continue rizatriptan  INSOMNIA / ANXIETY - sleep hygiene reviewed - consider psychology / psychiatry evaluation for anxiety disorder  Meds ordered this encounter  Medications  . carbamazepine (TEGRETOL) 200 MG tablet    Sig: Take 1 tablet (200 mg total) by mouth 2 (two) times daily.    Dispense:  60 tablet    Refill:  6  . topiramate (TOPAMAX) 50 MG tablet    Sig: Take 1-2 tablets (50-100 mg total) by mouth 2 (two) times daily.    Dispense:  120 tablet    Refill:  12  . rizatriptan (MAXALT-MLT) 10 MG disintegrating tablet    Sig: Take 1 tablet (10 mg total) by mouth as needed for migraine. May repeat in 2 hours if needed    Dispense:  9 tablet    Refill:  11   Return in about 6 months (around 08/17/2018).    Penni Bombard, MD 16/02/9603, 5:40 AM Certified in Neurology, Neurophysiology and Neuroimaging  [facility] [address] [place], [place] [address] [phone]

## 2018-02-28 ENCOUNTER — Emergency Department (HOSPITAL_COMMUNITY)
Admission: EM | Admit: 2018-02-28 | Discharge: 2018-03-01 | Disposition: A | Discharge status: Home / Self Care | Payer: Commercial Managed Care - PPO | Attending: Emergency Medicine | Admitting: Emergency Medicine

## 2018-02-28 ENCOUNTER — Encounter (HOSPITAL_COMMUNITY): Payer: Self-pay | Admitting: Emergency Medicine

## 2018-02-28 ENCOUNTER — Emergency Department (HOSPITAL_COMMUNITY): Payer: Commercial Managed Care - PPO

## 2018-02-28 ENCOUNTER — Other Ambulatory Visit: Payer: Self-pay

## 2018-02-28 DIAGNOSIS — R1010 Upper abdominal pain, unspecified: Secondary | ICD-10-CM

## 2018-02-28 DIAGNOSIS — R1031 Right lower quadrant pain: Secondary | ICD-10-CM | POA: Diagnosis not present

## 2018-02-28 DIAGNOSIS — R112 Nausea with vomiting, unspecified: Secondary | ICD-10-CM | POA: Insufficient documentation

## 2018-02-28 DIAGNOSIS — R1030 Lower abdominal pain, unspecified: Secondary | ICD-10-CM | POA: Diagnosis not present

## 2018-02-28 DIAGNOSIS — R109 Unspecified abdominal pain: Secondary | ICD-10-CM | POA: Diagnosis not present

## 2018-02-28 DIAGNOSIS — R197 Diarrhea, unspecified: Secondary | ICD-10-CM | POA: Insufficient documentation

## 2018-02-28 DIAGNOSIS — R1011 Right upper quadrant pain: Secondary | ICD-10-CM | POA: Diagnosis not present

## 2018-02-28 DIAGNOSIS — Z79899 Other long term (current) drug therapy: Secondary | ICD-10-CM | POA: Insufficient documentation

## 2018-02-28 LAB — URINALYSIS, ROUTINE W REFLEX MICROSCOPIC
Bilirubin Urine: NEGATIVE
GLUCOSE, UA: NEGATIVE mg/dL
HGB URINE DIPSTICK: NEGATIVE
KETONES UR: NEGATIVE mg/dL
Leukocytes, UA: NEGATIVE
Nitrite: NEGATIVE
PROTEIN: NEGATIVE mg/dL
Specific Gravity, Urine: 1.02 (ref 1.005–1.030)
pH: 5 (ref 5.0–8.0)

## 2018-02-28 LAB — COMPREHENSIVE METABOLIC PANEL
ALK PHOS: 73 U/L (ref 38–126)
ALT: 28 U/L (ref 0–44)
AST: 19 U/L (ref 15–41)
Albumin: 4.2 g/dL (ref 3.5–5.0)
Anion gap: 9 (ref 5–15)
BUN: 13 mg/dL (ref 6–20)
CALCIUM: 8.6 mg/dL — AB (ref 8.9–10.3)
CO2: 25 mmol/L (ref 22–32)
Chloride: 108 mmol/L (ref 98–111)
Creatinine, Ser: 0.82 mg/dL (ref 0.44–1.00)
GFR calc Af Amer: >60 mL/min (ref >60)
GFR calc non Af Amer: >60 mL/min (ref >60)
Glucose, Bld: 96 mg/dL (ref 70–99)
Potassium: 3.8 mmol/L (ref 3.5–5.1)
SODIUM: 142 mmol/L (ref 135–145)
Total Bilirubin: 0.4 mg/dL (ref 0.3–1.2)
Total Protein: 7.5 g/dL (ref 6.5–8.1)

## 2018-02-28 LAB — CBC
HCT: 42.2 % (ref 36.0–46.0)
Hemoglobin: 13.5 g/dL (ref 12.0–15.0)
MCH: 30.5 pg (ref 26.0–34.0)
MCHC: 32 g/dL (ref 30.0–36.0)
MCV: 95.3 fL (ref 80.0–100.0)
PLATELETS: 310 10*3/uL (ref 150–400)
RBC: 4.43 MIL/uL (ref 3.87–5.11)
RDW: 12.6 % (ref 11.5–15.5)
WBC: 9.3 10*3/uL (ref 4.0–10.5)
nRBC: 0 % (ref 0.0–0.2)

## 2018-02-28 LAB — I-STAT BETA HCG BLOOD, ED (MC, WL, AP ONLY): I-stat hCG, quantitative: <5 m[IU]/mL (ref <5)

## 2018-02-28 LAB — LIPASE, BLOOD: Lipase: 34 U/L (ref 11–51)

## 2018-02-28 MED ORDER — ONDANSETRON HCL 4 MG/2ML IJ SOLN
4.0000 mg | Freq: Once | INTRAMUSCULAR | Status: AC
  Administered 2018-02-28: 4 mg via INTRAVENOUS
  Filled 2018-02-28: qty 2

## 2018-02-28 MED ORDER — KETOROLAC TROMETHAMINE 30 MG/ML IJ SOLN
30.0000 mg | Freq: Once | INTRAMUSCULAR | Status: AC
  Administered 2018-02-28: 30 mg via INTRAVENOUS
  Filled 2018-02-28: qty 1

## 2018-02-28 NOTE — ED Notes (Signed)
Urine culture sent to lab.

## 2018-02-28 NOTE — ED Provider Notes (Signed)
[facility] DEPT Provider Note   CSN: [id number] Arrival date & time: 02/28/18  1657     History   Chief Complaint Chief Complaint  Patient presents with  . Abdominal Pain  . Nausea  . Emesis  . Diarrhea    HPI Debra Hensley is a 37 y.o. female past medical history of fibromyalgia, migraine presents for evaluation of progressive worsening abdominal pain.  She states is been ongoing for last few days since yesterday.  She additionally reports that last night, she started having nausea/vomiting.  Reports approxi-3 times episodes of vomiting since onset.  Emesis is nonbloody, nonbilious.  Additionally she has been having some diarrhea.  No blood in the stool.  She denies any recent travel or antibiotic use.  She states that it is mostly in the right upper quadrant but does radiate into the right lower side and goes around her back slightly.  No dysuria or hematuria.  She does not know if it is worse with food since she has not been eating or drinking since last night.  She states that it is worse with palpation.  No alleviating factors.  She is taken ibuprofen and Tylenol with minimal improvement.  Patient denies any fevers, chest pain, difficulty breathing.  The history is provided by the patient.    Past Medical History:  Diagnosis Date  . Breast mass 05/2014   left  . Complication of anesthesia    states had a problem with her heart rate during UHR, and was hard to wake up post-op; could not be more specific, but was not referred to cardiologist  . Fibromyalgia   . Migraine   . Mood disorder ([place])   . Obesity   . Tachycardia    states heart rate has been high for last 3 doctor visits, has not been referred to cardiolgist or endocrinologist    Patient Active Problem List   Diagnosis Date Noted  . Migraine with aura and without status migrainosus, not intractable 08/03/2017  . Left breast mass 05/15/2014    Past Surgical History:  Procedure  Laterality Date  . BREAST EXCISIONAL BIOPSY Left    2015  . BREAST LUMPECTOMY WITH RADIOACTIVE SEED LOCALIZATION Left 05/15/2014   Procedure: LEFT BREAST LUMPECTOMY WITH RADIOACTIVE SEED LOCALIZATION;  Surgeon: Fanny Skates, MD;  Location: [facility];  Service: General;  Laterality: Left;  . CHOLECYSTECTOMY  08/01/2010  . DILATION AND CURETTAGE OF UTERUS  01/06/2002  . ELBOW FRACTURE SURGERY Right    age 65  . NASAL TURBINATE REDUCTION    . TONSILLECTOMY  1989  . UMBILICAL HERNIA REPAIR  2010  . UMBILICAL HERNIA REPAIR  2010     OB History   None      Home Medications    Prior to Admission medications   Medication Sig Start Date End Date Taking? Authorizing Provider  ARIPiprazole (ABILIFY) 10 MG tablet Take 10 mg by mouth daily.  02/04/18  Yes [provider]  carbamazepine (TEGRETOL) 200 MG tablet Take 1 tablet (200 mg total) by mouth 2 (two) times daily. 02/15/18  Yes Penumalli, Earlean Polka, MD  cyclobenzaprine (FLEXERIL) 10 MG tablet Take 10 mg by mouth as needed for muscle spasms.    Yes [provider]  DULoxetine (CYMBALTA) 60 MG capsule Take 60 mg by mouth daily. 05/25/15  Yes [provider]  gabapentin (NEURONTIN) 300 MG capsule Take 300 mg by mouth daily.    Yes [provider]  naproxen  sodium (ANAPROX) 550 MG tablet Take 550 mg by mouth 2 (two) times daily as needed for mild pain.    Yes [provider]  promethazine (PHENERGAN) 12.5 MG tablet Take 1 tablet (12.5 mg total) by mouth every 8 (eight) hours as needed for nausea or vomiting. 08/02/15  Yes Penumalli, Vikram R, MD  rizatriptan (MAXALT-MLT) 10 MG disintegrating tablet Take 1 tablet (10 mg total) by mouth as needed for migraine. May repeat in 2 hours if needed 02/15/18  Yes Penumalli, Earlean Polka, MD  topiramate (TOPAMAX) 50 MG tablet Take 1-2 tablets (50-100 mg total) by mouth 2 (two) times daily. 02/15/18  Yes Penumalli, Earlean Polka, MD  Vitamin D, Ergocalciferol,  (DRISDOL) 50000 units CAPS capsule Take 50,000 Units by mouth 2 (two) times a week. 06/13/15  Yes [provider]  ALPRAZolam Duanne Moron) 0.5 MG tablet Take 1 tablet (0.5 mg total) by mouth as needed for anxiety (for sedation before MRI scan; take 1 hour before scan; may repeat 15 min before scan). Patient not taking: Reported on 03/01/2018 01/19/18   Penumalli, Earlean Polka, MD  dicyclomine (BENTYL) 20 MG tablet Take 1 tablet (20 mg total) by mouth 2 (two) times daily. 03/01/18   Volanda Napoleon, PA-C  ondansetron (ZOFRAN ODT) 4 MG disintegrating tablet Take 1 tablet (4 mg total) by mouth every 8 (eight) hours as needed for nausea or vomiting. 03/01/18   Volanda Napoleon, PA-C    Family History Family History  Problem Relation Age of Onset  . Ovarian cancer Mother   . Migraines Mother   . Hypertension Mother   . Migraines Son     Social History Social History   Tobacco Use  . Smoking status: Never Smoker  . Smokeless tobacco: Never Used  Substance Use Topics  . Alcohol use: Yes    Comment: rarely  . Drug use: No     Allergies   Adhesive [tape]; Hydrocodone; Codeine; and Percocet [oxycodone-acetaminophen]   Review of Systems Review of Systems  Constitutional: Negative for fever.  Respiratory: Negative for cough and shortness of breath.   Cardiovascular: Negative for chest pain.  Gastrointestinal: Positive for abdominal pain, diarrhea, nausea and vomiting.  Genitourinary: Negative for dysuria and hematuria.  Neurological: Negative for headaches.  All other systems reviewed and are negative.    Physical Exam Updated Vital Signs BP (!) 101/50 (BP Location: Right Arm)   Pulse 85   Temp 98.5 F (36.9 C) (Oral)   Resp 16   Ht 5\' 3"  (1.6 m)   Wt 97.1 kg   SpO2 100%   BMI 37.91 kg/m   Physical Exam  Constitutional: She is oriented to person, place, and time. She appears well-developed and well-nourished.  Appears uncomfortable but no acute distress   HENT:    Head: Normocephalic and atraumatic.  Mouth/Throat: Oropharynx is clear and moist and mucous membranes are normal.  Eyes: Pupils are equal, round, and reactive to light. Conjunctivae, EOM and lids are normal.  Neck: Full passive range of motion without pain.  Cardiovascular: Normal rate, regular rhythm, normal heart sounds and normal pulses. Exam reveals no gallop and no friction rub.  No murmur heard. Pulmonary/Chest: Effort normal and breath sounds normal.  Lungs clear to auscultation bilaterally.  Symmetric chest rise.  No wheezing, rales, rhonchi.  Abdominal: Soft. Normal appearance. There is tenderness in the right upper quadrant and right lower quadrant. There is CVA tenderness (right). There is no rigidity and no guarding.  Abdomen soft, nondistended.  Tenderness palpation of the right upper and right lower quadrant.  No specific McBurney's point tenderness but diffusely in the right lower quadrant.  She has positive right-sided CVA tenderness.  Musculoskeletal: Normal range of motion.  Neurological: She is alert and oriented to person, place, and time.  Skin: Skin is warm and dry. Capillary refill takes less than 2 seconds.  Psychiatric: She has a normal mood and affect. Her speech is normal.  Nursing note and vitals reviewed.    ED Treatments / Results  Labs (all labs ordered are listed, but only abnormal results are displayed) Labs Reviewed  COMPREHENSIVE METABOLIC PANEL - Abnormal; Notable for the following components:      Result Value   Calcium 8.6 (*)    All other components within normal limits  LIPASE, BLOOD  CBC  URINALYSIS, ROUTINE W REFLEX MICROSCOPIC  I-STAT BETA HCG BLOOD, ED ([facility], [facility], [facility] ONLY)    EKG None  Radiology Ct Renal Stone Study  Result Date: 03/01/2018 CLINICAL DATA:  Right flank pain 2 days with nausea and vomiting. EXAM: CT ABDOMEN AND PELVIS WITHOUT CONTRAST TECHNIQUE: Multidetector CT imaging of the abdomen and pelvis was performed following  the standard protocol without IV contrast. COMPARISON:  11/25/2015 FINDINGS: Lower chest: No acute findings. Hepatobiliary: Previous cholecystectomy. Liver and biliary tree are normal. Pancreas: Normal. Spleen: Normal. Adrenals/Urinary Tract: Adrenal glands are normal. Kidneys are normal in size without hydronephrosis or nephrolithiasis. Ureters and bladder are normal. Stomach/Bowel: Stomach and small bowel are normal. Appendix is normal. Colon is normal. Vascular/Lymphatic: Normal. Reproductive: IUD in adequate position.  Ovaries within normal. Other: No free fluid or inflammatory change. Musculoskeletal: Normal. IMPRESSION: No acute findings in the abdomen/pelvis. Electronically Signed   By: Marin Olp M.D.   On: 03/01/2018 00:59    Procedures Procedures (including critical care time)  Medications Ordered in ED Medications  ondansetron (ZOFRAN) injection 4 mg (4 mg Intravenous Given 02/28/18 2317)  ketorolac (TORADOL) 30 MG/ML injection 30 mg (30 mg Intravenous Given 02/28/18 2318)  sodium chloride 0.9 % bolus 500 mL (0 mLs Intravenous Stopped 03/01/18 0156)     Initial Impression / Assessment and Plan / ED Course  I have reviewed the triage vital signs and the nursing notes.  Pertinent labs & imaging results that were available during my care of the patient were reviewed by me and considered in my medical decision making (see chart for details).     37 year old female who presents for evaluation of abdominal pain that is been ongoing for the last few days.  Worsened last night and associated with nausea/vomiting/diarrhea.  No fevers, urinary complaints. Patient is afebrile, non-toxic appearing, sitting comfortably on examination table. Vital signs reviewed and stable.  On exam, patient has tenderness palpation noted both right upper and right lower quadrant as well as some right-sided CVA tenderness.  Her tenderness in the right lower quadrant is more diffuse without focal at McBurney's  point.  Low suspicion for appendicitis given history/physical exam but a consideration.  Additionally, given CVA tenderness, consider kidney stone.  Low suspicion for hepatobiliary etiology.  History/physical exam is not concerning for tubo-ovarian abscess, ovarian torsion.  I0stat beta negative.  CMP is unremarkable.  UA is negative for any infectious etiology.  Lipase unremarkable.  CBC shows no leukocytosis or anemia.  Patient has a history of cholecystectomy though her pain could be due to a retained stone.  Given that she has both tender in the right upper and right lower quadrant as well  as CVA tenderness, will get a CT renal study for evaluation of possible kidney versus hepatobiliary versus infectious etiology.  CT abdomen pelvis without contrast shows normal liver and biliary tree.  Previous cholecystectomy.  No evidence of kidney stones.  Appendix is normal.  CT and pelvis shows no acute findings.  Discussed results with patient.  She reports improvement in symptoms since being here in the ED.  Repeat abdominal exam shows improvement she still has some slight tenderness in the upper abdominal region but significantly improved.  No lower abdominal tenderness.  Patient has been able to tolerate p.o. in the department without any difficulty.  Given history/physical exam and reassuring work-up, suspect this is most likely a viral GI process.  I discussed with patient that I do not have high suspicion for ovarian etiology given duration of symptoms.  I offered to do pelvic exam here in the ED for further evaluation but patient declined pelvic.  I feel this is reasonable given my low suspicion for OB/GYN etiology.  Plan to send patient home with Zofran and Bentyl for symptomatic relief.  Encourage at home supportive care measures. Patient had ample opportunity for questions and discussion. All patient's questions were answered with full understanding. Strict return precautions discussed. Patient expresses  understanding and agreement to plan.   Final Clinical Impressions(s) / ED Diagnoses   Final diagnoses:  Nausea vomiting and diarrhea  Pain of upper abdomen    ED Discharge Orders         Ordered    dicyclomine (BENTYL) 20 MG tablet  2 times daily     03/01/18 0134    ondansetron (ZOFRAN ODT) 4 MG disintegrating tablet  Every 8 hours PRN     03/01/18 0134           Volanda Napoleon, PA-C 03/01/18 0205    Charlesetta Shanks, MD 03/09/18 1524

## 2018-02-28 NOTE — ED Triage Notes (Signed)
Patient here from home with complaints of abd pain, nausea, vomiting, diarrhea that started on Saturday. Reports pain to right abd near belly button. Denies pregnancy.

## 2018-03-01 DIAGNOSIS — Z79899 Other long term (current) drug therapy: Secondary | ICD-10-CM | POA: Diagnosis not present

## 2018-03-01 DIAGNOSIS — R197 Diarrhea, unspecified: Secondary | ICD-10-CM | POA: Diagnosis not present

## 2018-03-01 DIAGNOSIS — R112 Nausea with vomiting, unspecified: Secondary | ICD-10-CM | POA: Diagnosis not present

## 2018-03-01 MED ORDER — SODIUM CHLORIDE 0.9 % IV BOLUS
500.0000 mL | Freq: Once | INTRAVENOUS | Status: AC
  Administered 2018-03-01: 500 mL via INTRAVENOUS

## 2018-03-01 MED ORDER — ONDANSETRON 4 MG PO TBDP: 4.0000 mg | ORAL_TABLET | Freq: Three times a day (TID) | ORAL | 0 refills | Status: DC | PRN

## 2018-03-01 MED ORDER — DICYCLOMINE HCL 20 MG PO TABS: 20.0000 mg | ORAL_TABLET | Freq: Two times a day (BID) | ORAL | 0 refills | Status: DC

## 2018-03-01 NOTE — ED Notes (Signed)
Pt tolerating PO fluids at this time without any difficulty.

## 2018-03-01 NOTE — Discharge Instructions (Signed)
Take Bentyl as directed for pain.  Take take Zofran for nausea.  Follow the BRAT (bananas, rice, applesauce, toast) to help with diarrhea.  Follow-up with your primary care doctor next 3 to 4 days.  Return to the Emergency Department immediately if you experience any worsening abdominal pain, fever, persistent nausea and vomiting, inability keep any food down, pain with urination, blood in your urine or any other worsening or concerning symptoms.

## 2018-05-18 ENCOUNTER — Telehealth: Payer: Self-pay | Admitting: Diagnostic Neuroimaging

## 2018-05-18 NOTE — Telephone Encounter (Signed)
I LMVM for pt to return call to make appt, and may increase carbamazepine to 400mg  po  BID.

## 2018-05-18 NOTE — Telephone Encounter (Signed)
Spoke to Debra Hensley and she stated that she is having episode of pain ( L side back of ear, facial,  top of head).  She states when it hits it is debilitating, she cannot talk, move (lies falt) and is shaky all over due to pain.  She had episode last week and was out for 2 days then this Mon and Tues , today she can talk.  She is taking topamax 100mg  po bid, carbamazepine 200mg  po BID, cymbalta 60mg  po daily.  She stated when she was in for appt she stated the wrong side (it is her L side).  Please advise with recommendations.

## 2018-05-18 NOTE — Telephone Encounter (Signed)
May increase CBZ to 400mg  twice a day and setup follow up appt. -VRP

## 2018-05-18 NOTE — Telephone Encounter (Signed)
Pt has called to inform that for 2 days straight she has experienced a bad episode of crucial pain on left side of face,back of neck, top of head, left ear, left arm. Pt was asked if she felt like she needed to go to ED she said she was better today and able to talk but still feels pain.  Pt has accepted 1st available appointment and is on wait list, pt asking for a call from RN as soon as possible to discuss pain

## 2018-05-19 ENCOUNTER — Encounter: Payer: Self-pay | Admitting: *Deleted

## 2018-05-19 NOTE — Telephone Encounter (Signed)
Pt has returned the call to [place] to let her know she got message and is asking to be called if an earlier appointment becomes available for her

## 2018-05-19 NOTE — Telephone Encounter (Signed)
LMVM for pt that returning call again to make sure she got my message about increase of medication and also making appt.

## 2018-05-19 NOTE — Telephone Encounter (Signed)
Sent mychart message for 05/25/2018 at 3p, if that works.

## 2018-05-23 NOTE — Telephone Encounter (Signed)
LMVM for pt to return call about moving appt up.

## 2018-05-24 ENCOUNTER — Encounter: Payer: Self-pay | Admitting: *Deleted

## 2018-05-24 NOTE — Telephone Encounter (Signed)
Mailed letter °

## 2018-06-03 DIAGNOSIS — J029 Acute pharyngitis, unspecified: Secondary | ICD-10-CM | POA: Diagnosis not present

## 2018-06-03 DIAGNOSIS — J019 Acute sinusitis, unspecified: Secondary | ICD-10-CM | POA: Diagnosis not present

## 2018-06-10 ENCOUNTER — Telehealth: Payer: Self-pay | Admitting: *Deleted

## 2018-06-10 NOTE — Telephone Encounter (Signed)
Spoke to pt and relayed that can increase gabapentin 300mg  TID, stop flexeril (which she takes prn).  Moved up appt 06-13-2018 at 230pm.  She is w/o insurance right at the moment.  She is asking about cost.  She will call billing Monday am and see.  I will forward this to billing as well.  She has enough to increase w/out updating prescription before appt.

## 2018-06-10 NOTE — Telephone Encounter (Signed)
Pt called and is having exacerbation of L facial TN pain.  She relayed had just finished ABX for sinus infection/virus thru pcp started last Friday.  Was feeling better then Wednesday thru now has had L facil TN pain, excruiating pain, L face to neck.  She states she is taking topiramate 100mg  BID,  Gabapentin 300mg  daily, duloxetine 60mg  daily,  Flexeril 10mg  prn, carbamazepine 400mg  po BID.  She is still in a lot of pain.  She has appt 07-04-2018. She also stated having progressivley worsening pins/ needles tingling in arms and legs (worse).  Please advise.  Dr. Leta Baptist out of office.

## 2018-06-10 NOTE — Telephone Encounter (Signed)
Please call patient, she may take higher dose of gabapentin 300mg  tid.   Please advise patient potential side effect from polypharmacy treatment, she may potentially stop Flexeril,  Move up her appointment

## 2018-06-13 ENCOUNTER — Encounter: Payer: Self-pay | Admitting: Diagnostic Neuroimaging

## 2018-06-13 ENCOUNTER — Ambulatory Visit: Payer: Self-pay | Admitting: Diagnostic Neuroimaging

## 2018-06-13 VITALS — BP 122/88 | HR 95 | Ht 63.0 in | Wt 212.0 lb

## 2018-06-13 DIAGNOSIS — R51 Headache: Secondary | ICD-10-CM

## 2018-06-13 DIAGNOSIS — G44091 Other trigeminal autonomic cephalgias (TAC), intractable: Secondary | ICD-10-CM

## 2018-06-13 DIAGNOSIS — R519 Headache, unspecified: Secondary | ICD-10-CM

## 2018-06-13 DIAGNOSIS — G43109 Migraine with aura, not intractable, without status migrainosus: Secondary | ICD-10-CM

## 2018-06-13 MED ORDER — RIZATRIPTAN BENZOATE 10 MG PO TBDP: 10.0000 mg | ORAL_TABLET | ORAL | 11 refills | Status: DC | PRN

## 2018-06-13 MED ORDER — CARBAMAZEPINE 200 MG PO TABS: 200.0000 mg | ORAL_TABLET | Freq: Two times a day (BID) | ORAL | 12 refills | Status: DC

## 2018-06-13 MED ORDER — TOPIRAMATE 100 MG PO TABS: 100.0000 mg | ORAL_TABLET | Freq: Two times a day (BID) | ORAL | 12 refills | Status: DC

## 2018-06-13 NOTE — Patient Instructions (Signed)
  LEFT FACE PAIN - repeat MRI brain (w/wo); when insurance is activated - increase carbamazepine to 400mg  twice a day  MIGRAINE WITH AURA - topiramate 100mg  twice a day - continue rizatriptan  NUMBNESS / TINGLING - check B12, TSH, A1c (patient will return when insurance is activated)  LEFT NECK / SHOULDER PAIN / FIBROMYALGIA - conservative mgmt; cymbalta and gabapentin per PCP - follow up with PCP; consider pain mgmt  INSOMNIA / ANXIETY - sleep hygiene reviewed - consider psychology / psychiatry evaluation for anxiety disorder

## 2018-06-13 NOTE — Progress Notes (Signed)
[facility]  PATIENT: Debra Hensley DOB: [birth year]  REFERRING CLINICIAN: Cipriano Mile  HISTORY FROM: patient  REASON FOR VISIT: follow up   HISTORICAL  CHIEF COMPLAINT:  Chief Complaint  Patient presents with  . Trigeminal neuralgia    rm 7, "worseniung symptoms, req earlier FU"  . Follow-up    HISTORY OF PRESENT ILLNESS:   UPDATE (06/13/18, VRP): Since last visit, doing poorly. More LEFT facial pain (not right side as previously indicated; patient corrects this fact for [place] on this visit). Symptoms last hours - days at a time. Has poor sleep. Symptoms are progressive. CBZ not helping. Feels tired all the time. No alleviating or aggravating factors.  Migraine are improved (1 per month now).   UPDATE (02/15/18, VRP): Since last visit, doing poorly. Symptoms are worsening and severe. Avg 1-2 migraine per month and rizatriptan helps. Right facial pain worsening, constant, severe. No alleviating or aggravating factors.    UPDATE (08/03/17, VRP): Since last visit, patient continues to have migraines approximately 1-2/month.  However patient has also developed a different kind of pain problem for past 3 years, worse in the last 6-12 months, with sudden sharp pain in her right shoulder radiating to her right neck, right face and right eye.  Sometimes pain radiates down her right arm and has some numbness and tingling.  Sometimes has photophobia and phonophobia.  Pain is very severe and can last all day.  Patient also has some scalp sensitivity with this problem.    Patient continues to have significant sleep disturbance, averaging 3-4 hours of sleep per night.  This is related to insomnia and anxiety feelings as well as crowded bedroom where most nights 2 children and their family dog sleep in their bed.  UPDATE 08/02/15: Since last visit, now on TPX + sumatriptan. Still 1 HA every other week. Last week had migraine lasting 4 days. No triggers noted for HA. Int numbness still  occuring.   PRIOR HPI 06/21/15: 38 year old right-handed female here for evaluation of numbness and tingling. Symptoms have been present for past 2-3 months. She describes migratory right greater than left side, arms, hands, legs and feet numbness and tingling sensation. Symptoms last minutes at a time can occur multiple times a day. She has these sensations approximately 10 days per month. Symptoms are particularly noticeable during migraine headache attacks. Patient has had migraine headaches since age 23 years old. She has a warning of scintillating visual phenomenon, followed by severe global headache with neck pain, shoulder pain, nausea, photophobia and phonophobia. When she was younger she would pass out from severe headaches. For most of her life she was having 1 migraine headache every few months. Over past few years this has increased to at least one migraine attack per month, which can last at least 2 days. In the last few months she has been averaging 2-4 days of headache per month. She's never been on preventative medication. She has been on Imitrex 100 mg as needed for rescue medication. This seems to work well. She also takes [phone] mg of Tylenol every day for past few months. Patient also has history of depression, anxiety and fibromyalgia. She is on Cymbalta, gabapentin and Vicodin as needed. No other specific triggering factors, change in stress, diet, exercise, sleep. She does have chronic fatigue in spite of adequate rest at nighttime. She had a sleep study a few years ago which apparently was negative for sleep apnea although she did not sleep well during the study.  REVIEW OF SYSTEMS: Full 14 system review of systems performed and negative except: insomnia pain headaches.   ALLERGIES: Allergies  Allergen Reactions  . Adhesive [Tape] Other (See Comments)    BLISTERS  . Hydrocodone Itching  . Codeine Itching  . Percocet [Oxycodone-Acetaminophen] Itching    HOME  MEDICATIONS: Outpatient Medications Prior to Visit  Medication Sig Dispense Refill  . carbamazepine (TEGRETOL) 200 MG tablet Take 1 tablet (200 mg total) by mouth 2 (two) times daily. 60 tablet 6  . dicyclomine (BENTYL) 20 MG tablet Take 1 tablet (20 mg total) by mouth 2 (two) times daily. 10 tablet 0  . DULoxetine (CYMBALTA) 60 MG capsule Take 60 mg by mouth daily.  5  . gabapentin (NEURONTIN) 300 MG capsule Take 300 mg by mouth daily.     . naproxen sodium (ANAPROX) 550 MG tablet Take 550 mg by mouth 2 (two) times daily as needed for mild pain.     Marland Kitchen ondansetron (ZOFRAN ODT) 4 MG disintegrating tablet Take 1 tablet (4 mg total) by mouth every 8 (eight) hours as needed for nausea or vomiting. 10 tablet 0  . promethazine (PHENERGAN) 12.5 MG tablet Take 1 tablet (12.5 mg total) by mouth every 8 (eight) hours as needed for nausea or vomiting. 30 tablet 0  . rizatriptan (MAXALT-MLT) 10 MG disintegrating tablet Take 1 tablet (10 mg total) by mouth as needed for migraine. May repeat in 2 hours if needed 9 tablet 11  . topiramate (TOPAMAX) 50 MG tablet Take 1-2 tablets (50-100 mg total) by mouth 2 (two) times daily. 120 tablet 12  . Vitamin D, Ergocalciferol, (DRISDOL) 50000 units CAPS capsule Take 50,000 Units by mouth 2 (two) times a week.  0  . ARIPiprazole (ABILIFY) 10 MG tablet Take 10 mg by mouth daily.   0  . cyclobenzaprine (FLEXERIL) 10 MG tablet Take 10 mg by mouth as needed for muscle spasms.     . ALPRAZolam (XANAX) 0.5 MG tablet Take 1 tablet (0.5 mg total) by mouth as needed for anxiety (for sedation before MRI scan; take 1 hour before scan; may repeat 15 min before scan). (Patient not taking: Reported on 03/01/2018) 3 tablet 0   No facility-administered medications prior to visit.     PAST MEDICAL HISTORY: Past Medical History:  Diagnosis Date  . Breast mass 05/2014   left  . Complication of anesthesia    states had a problem with her heart rate during UHR, and was hard to wake up  post-op; could not be more specific, but was not referred to cardiologist  . Fibromyalgia   . Migraine   . Mood disorder ([place])   . Obesity   . Tachycardia    states heart rate has been high for last 3 doctor visits, has not been referred to cardiolgist or endocrinologist    PAST SURGICAL HISTORY: Past Surgical History:  Procedure Laterality Date  . BREAST EXCISIONAL BIOPSY Left    2015  . BREAST LUMPECTOMY WITH RADIOACTIVE SEED LOCALIZATION Left 05/15/2014   Procedure: LEFT BREAST LUMPECTOMY WITH RADIOACTIVE SEED LOCALIZATION;  Surgeon: Fanny Skates, MD;  Location: [facility];  Service: General;  Laterality: Left;  . CHOLECYSTECTOMY  08/01/2010  . DILATION AND CURETTAGE OF UTERUS  01/06/2002  . ELBOW FRACTURE SURGERY Right    age 13  . NASAL TURBINATE REDUCTION    . TONSILLECTOMY  1989  . UMBILICAL HERNIA REPAIR  2010  . UMBILICAL HERNIA REPAIR  2010    FAMILY HISTORY:  Family History  Problem Relation Age of Onset  . Ovarian cancer Mother   . Migraines Mother   . Hypertension Mother   . Migraines Son     SOCIAL HISTORY:  Social History   Socioeconomic History  . Marital status: Married    Spouse name: Legrand Como  . Number of children: 3  . Years of education: 49  . Highest education level: Not on file  Occupational History  . Occupation: Stay at home  Social Needs  . Financial resource strain: Not on file  . Food insecurity:    Worry: Not on file    Inability: Not on file  . Transportation needs:    Medical: Not on file    Non-medical: Not on file  Tobacco Use  . Smoking status: Never Smoker  . Smokeless tobacco: Never Used  Substance and Sexual Activity  . Alcohol use: Yes    Comment: rarely  . Drug use: No  . Sexual activity: Yes    Birth control/protection: I.U.D.  Lifestyle  . Physical activity:    Days per week: Not on file    Minutes per session: Not on file  . Stress: Not on file  Relationships  . Social connections:    Talks on  phone: Not on file    Gets together: Not on file    Attends religious service: Not on file    Active member of club or organization: Not on file    Attends meetings of clubs or organizations: Not on file    Relationship status: Not on file  . Intimate partner violence:    Fear of current or ex partner: Not on file    Emotionally abused: Not on file    Physically abused: Not on file    Forced sexual activity: Not on file  Other Topics Concern  . Not on file  Social History Narrative   Lives at home with husband and 3 kids   Education -    Caffeine use: 1 cup daily    PHYSICAL EXAM  GENERAL EXAM/CONSTITUTIONAL: Vitals:  Vitals:   06/13/18 1428  BP: 122/88  Pulse: 95  Weight: 212 lb (96.2 kg)  Height: 5\' 3"  (1.6 m)   Wt Readings from Last 3 Encounters:  06/13/18 212 lb (96.2 kg)  02/28/18 214 lb (97.1 kg)  02/15/18 212 lb (96.2 kg)    Body mass index is 37.55 kg/m. No exam data present  Patient is in NOT IN ACUTE DISTRESS; well developed, nourished and groomed; neck is supple  CARDIOVASCULAR:  Examination of carotid arteries is normal; no carotid bruits  Regular rate and rhythm, no murmurs  Examination of peripheral vascular system by observation and palpation is normal  EYES:  Ophthalmoscopic exam of optic discs and posterior segments is normal; no papilledema or hemorrhages  MUSCULOSKELETAL:  Gait, strength, tone, movements noted in Neurologic exam below  NEUROLOGIC: MENTAL STATUS:  No flowsheet data found.  awake, alert, oriented to person, place and time  recent and remote memory intact  normal attention and concentration  language fluent, comprehension intact, naming intact,   fund of knowledge appropriate  CRANIAL NERVE:   2nd - no papilledema on fundoscopic exam  2nd, 3rd, 4th, 6th - pupils equal and reactive to light, visual fields full to confrontation, extraocular muscles intact, no nystagmus  5th - facial sensation SYMM  7th -  facial strength symmetric  8th - hearing intact  9th - palate elevates symmetrically, uvula midline  11th - shoulder  shrug symmetric  12th - tongue protrusion midline  MOTOR:   normal bulk and tone, full strength in the BUE, BLE  SENSORY:   normal and symmetric to light touch  COORDINATION:   finger-nose-finger, fine finger movements normal  REFLEXES:   deep tendon reflexes TRACE and symmetric  GAIT/STATION:   narrow based gait    DIAGNOSTIC DATA (LABS, IMAGING, TESTING) - I reviewed patient records, labs, notes, testing and imaging myself where available.  Lab Results  Component Value Date   WBC 9.3 02/28/2018   HGB 13.5 02/28/2018   HCT 42.2 02/28/2018   MCV 95.3 02/28/2018   PLT 310 02/28/2018      Component Value Date/Time   NA 142 02/28/2018 1815   K 3.8 02/28/2018 1815   CL 108 02/28/2018 1815   CO2 25 02/28/2018 1815   GLUCOSE 96 02/28/2018 1815   BUN 13 02/28/2018 1815   CREATININE 0.82 02/28/2018 1815   CALCIUM 8.6 (L) 02/28/2018 1815   PROT 7.5 02/28/2018 1815   ALBUMIN 4.2 02/28/2018 1815   AST 19 02/28/2018 1815   ALT 28 02/28/2018 1815   ALKPHOS 73 02/28/2018 1815   BILITOT 0.4 02/28/2018 1815   GFRNONAA >60 02/28/2018 1815   GFRAA >60 02/28/2018 1815   No results found for: CHOL, HDL, LDLCALC, LDLDIRECT, TRIG, CHOLHDL No results found for: HGBA1C No results found for: VITAMINB12 No results found for: TSH  04/08/12 xray lumbar [I reviewed images myself and agree with interpretation. -VRP]  - negative  02/13/18 MRI brain and cervical [I reviewed images myself and agree with interpretation. Except small right thalamic non-specific gliosis.  -VRP]  - unremarkable    ASSESSMENT AND PLAN  38 y.o. year old female here with 2-3 months migratory numbness, sometimes assoc with migraine but sometimes independent. Also with worsening LEFT facial pain, eye pain, shoulder and arm pain / numbness (? Related to right thalamic lesion).     Dx: migraine phenomenon + idiopathic trigeminal neuralgia + fibromyalgia  1. Left facial pain   2. Migraine with aura and without status migrainosus, not intractable   3. Other intractable trigeminal autonomic cephalgia (TAC)      PLAN:  LEFT FACE PAIN (? idiopathic trigeminal neuralgia) - repeat MRI brain (w/wo) to re-evaluate right thalamic lesion; when insurance is activated - increase carbamazepine to 400mg  twice a day  MIGRAINE WITH AURA - topiramate 100mg  twice a day - continue rizatriptan  NUMBNESS / TINGLING (idiopathic) - check B12, TSH, A1c (patient will return when insurance is activated)  LEFT NECK / SHOULDER PAIN / FIBROMYALGIA - conservative mgmt; cymbalta - follow up with PCP; consider pain mgmt  INSOMNIA / ANXIETY - sleep hygiene reviewed - consider psychology / psychiatry evaluation for anxiety disorder  Meds ordered this encounter  Medications  . topiramate (TOPAMAX) 100 MG tablet    Sig: Take 1 tablet (100 mg total) by mouth 2 (two) times daily.    Dispense:  60 tablet    Refill:  12  . rizatriptan (MAXALT-MLT) 10 MG disintegrating tablet    Sig: Take 1 tablet (10 mg total) by mouth as needed for migraine. May repeat in 2 hours if needed    Dispense:  9 tablet    Refill:  11  . carbamazepine (TEGRETOL) 200 MG tablet    Sig: Take 1-2 tablets (200-400 mg total) by mouth 2 (two) times daily.    Dispense:  120 tablet    Refill:  12   Orders Placed This Encounter  Procedures  . MR BRAIN W WO CONTRAST  . Vitamin B12  . TSH  . Hemoglobin A1c   Return in about 6 months (around 12/12/2018).    Penni Bombard, MD 2/63/7858, 8:50 PM Certified in Neurology, Neurophysiology and Neuroimaging  [facility] [address], [place], [place] [address] [phone]

## 2018-06-15 ENCOUNTER — Telehealth: Payer: Self-pay | Admitting: Diagnostic Neuroimaging

## 2018-06-15 NOTE — Telephone Encounter (Signed)
spoke to the patient she informed me she is getting new insurance & as soon as she gets it she will call me with the info

## 2018-06-20 NOTE — Telephone Encounter (Signed)
Patient stated that it is [facility] and that ID number should be the same as the [facility] plan. ID number is [id number].

## 2018-06-20 NOTE — Telephone Encounter (Signed)
I spoke to the patient and informed her when I put that ID number in it comes out inactive. She stated she will call them and once she gets it figured out will give me a call back.

## 2018-06-23 DIAGNOSIS — R6889 Other general symptoms and signs: Secondary | ICD-10-CM | POA: Diagnosis not present

## 2018-06-23 DIAGNOSIS — R05 Cough: Secondary | ICD-10-CM | POA: Diagnosis not present

## 2018-06-30 MED ORDER — ALPRAZOLAM 0.5 MG PO TABS: ORAL_TABLET | ORAL | 0 refills | Status: DC

## 2018-06-30 NOTE — Addendum Note (Signed)
Addended by: Andrey Spearman R on: 06/30/2018 01:58 PM   Modules accepted: Orders

## 2018-06-30 NOTE — Telephone Encounter (Signed)
MR Brain w/wo contrast Dr. Su Ley Auth: [id number] (exp. 06/29/18 to 07/28/18). Patient is scheduled for 07/05/18 at [facility].  She also informed me she is claustrophobic and would like something to help her.

## 2018-06-30 NOTE — Telephone Encounter (Signed)
LMVM for pt that xanax called in for MRI, will need driver.  She is tcb if questions.

## 2018-06-30 NOTE — Addendum Note (Signed)
Addended by: Brandon Melnick on: 06/30/2018 09:08 AM   Modules accepted: Orders

## 2018-07-04 ENCOUNTER — Encounter

## 2018-07-04 ENCOUNTER — Ambulatory Visit: Payer: Commercial Managed Care - PPO | Admitting: Diagnostic Neuroimaging

## 2018-07-04 NOTE — Telephone Encounter (Signed)
Spoke to pt.  She did not fill hydrocodone syr as she is allergic to it.  [facility] [place], spoke to pharmacist and they will fill xanax as only for 2 tablets.  Pt will use this pharmacy, but will change to [facility] on [address].

## 2018-07-05 ENCOUNTER — Ambulatory Visit: Payer: Self-pay

## 2018-07-05 DIAGNOSIS — R51 Headache: Secondary | ICD-10-CM

## 2018-07-05 DIAGNOSIS — R519 Headache, unspecified: Secondary | ICD-10-CM

## 2018-07-05 DIAGNOSIS — G44091 Other trigeminal autonomic cephalgias (TAC), intractable: Secondary | ICD-10-CM

## 2018-07-05 MED ORDER — GADOBENATE DIMEGLUMINE 529 MG/ML IV SOLN
20.0000 mL | Freq: Once | INTRAVENOUS | Status: AC | PRN
  Administered 2018-07-05: 20 mL via INTRAVENOUS

## 2018-07-05 NOTE — Addendum Note (Signed)
Addended by: Inis Sizer D on: 07/05/2018 03:16 PM   Modules accepted: Orders

## 2018-07-06 ENCOUNTER — Encounter: Payer: Self-pay | Admitting: *Deleted

## 2018-07-06 LAB — VITAMIN B12: Vitamin B-12: 486 pg/mL (ref 232–1245)

## 2018-07-06 LAB — TSH: TSH: 0.612 u[IU]/mL (ref 0.450–4.500)

## 2018-07-06 LAB — HEMOGLOBIN A1C
ESTIMATED AVERAGE GLUCOSE: 111 mg/dL
HEMOGLOBIN A1C: 5.5 % (ref 4.8–5.6)

## 2018-07-08 ENCOUNTER — Telehealth: Payer: Self-pay | Admitting: *Deleted

## 2018-07-08 NOTE — Telephone Encounter (Signed)
Spoke with patient and informed her that the MRI brain result was unremarkable with no acute findings. Patient verbalized understanding, appreciation.

## 2018-08-22 ENCOUNTER — Ambulatory Visit: Payer: Commercial Managed Care - PPO | Admitting: Diagnostic Neuroimaging

## 2018-09-02 DIAGNOSIS — R51 Headache: Secondary | ICD-10-CM | POA: Diagnosis not present

## 2018-09-02 DIAGNOSIS — F329 Major depressive disorder, single episode, unspecified: Secondary | ICD-10-CM | POA: Diagnosis not present

## 2018-09-02 DIAGNOSIS — R413 Other amnesia: Secondary | ICD-10-CM | POA: Diagnosis not present

## 2018-09-02 DIAGNOSIS — G939 Disorder of brain, unspecified: Secondary | ICD-10-CM | POA: Diagnosis not present

## 2018-10-05 DIAGNOSIS — G500 Trigeminal neuralgia: Secondary | ICD-10-CM | POA: Diagnosis not present

## 2018-10-21 ENCOUNTER — Other Ambulatory Visit: Payer: Self-pay | Admitting: Neurosurgery

## 2018-10-21 DIAGNOSIS — M542 Cervicalgia: Secondary | ICD-10-CM

## 2018-10-25 ENCOUNTER — Other Ambulatory Visit: Payer: BLUE CROSS/BLUE SHIELD

## 2018-10-28 ENCOUNTER — Telehealth: Payer: Self-pay | Admitting: Diagnostic Neuroimaging

## 2018-10-28 NOTE — Telephone Encounter (Signed)
Pt states that her PCP referred her to another Neurologist and they referred her to get a Spinal MRI. Pt would like the RN or provider to call her back to discuss this. Please advise.

## 2018-10-29 ENCOUNTER — Other Ambulatory Visit: Payer: BLUE CROSS/BLUE SHIELD

## 2018-10-31 NOTE — Telephone Encounter (Signed)
Attempted to reach patient to discuss. No answer; voice MB not set up.

## 2018-11-01 ENCOUNTER — Encounter: Payer: Self-pay | Admitting: *Deleted

## 2018-11-01 NOTE — Telephone Encounter (Addendum)
Attempt #2 to reach patient. No answer, voice MB not set up. Sent my chart message.

## 2018-11-14 ENCOUNTER — Other Ambulatory Visit: Payer: Self-pay | Admitting: Obstetrics and Gynecology

## 2018-11-14 DIAGNOSIS — N6310 Unspecified lump in the right breast, unspecified quadrant: Secondary | ICD-10-CM

## 2018-11-18 ENCOUNTER — Ambulatory Visit
Admission: RE | Admit: 2018-11-18 | Discharge: 2018-11-18 | Disposition: A | Discharge status: Home / Self Care | Payer: BLUE CROSS/BLUE SHIELD | Source: Ambulatory Visit | Attending: Obstetrics and Gynecology | Admitting: Obstetrics and Gynecology

## 2018-11-18 ENCOUNTER — Other Ambulatory Visit: Payer: Self-pay

## 2018-11-18 DIAGNOSIS — N6310 Unspecified lump in the right breast, unspecified quadrant: Secondary | ICD-10-CM

## 2018-11-18 DIAGNOSIS — N6452 Nipple discharge: Secondary | ICD-10-CM | POA: Diagnosis not present

## 2018-11-18 DIAGNOSIS — R928 Other abnormal and inconclusive findings on diagnostic imaging of breast: Secondary | ICD-10-CM | POA: Diagnosis not present

## 2018-12-07 ENCOUNTER — Other Ambulatory Visit: Payer: Self-pay

## 2018-12-07 ENCOUNTER — Emergency Department (HOSPITAL_COMMUNITY)
Admission: EM | Admit: 2018-12-07 | Discharge: 2018-12-07 | Disposition: A | Discharge status: Home / Self Care | Payer: BLUE CROSS/BLUE SHIELD | Attending: Emergency Medicine | Admitting: Emergency Medicine

## 2018-12-07 ENCOUNTER — Encounter (HOSPITAL_COMMUNITY): Payer: Self-pay

## 2018-12-07 ENCOUNTER — Telehealth: Payer: Self-pay | Admitting: Diagnostic Neuroimaging

## 2018-12-07 ENCOUNTER — Emergency Department (HOSPITAL_COMMUNITY): Payer: BLUE CROSS/BLUE SHIELD

## 2018-12-07 DIAGNOSIS — Z79899 Other long term (current) drug therapy: Secondary | ICD-10-CM | POA: Insufficient documentation

## 2018-12-07 DIAGNOSIS — R51 Headache: Secondary | ICD-10-CM | POA: Diagnosis not present

## 2018-12-07 DIAGNOSIS — G500 Trigeminal neuralgia: Secondary | ICD-10-CM | POA: Insufficient documentation

## 2018-12-07 DIAGNOSIS — M797 Fibromyalgia: Secondary | ICD-10-CM | POA: Insufficient documentation

## 2018-12-07 DIAGNOSIS — M542 Cervicalgia: Secondary | ICD-10-CM | POA: Insufficient documentation

## 2018-12-07 DIAGNOSIS — R519 Headache, unspecified: Secondary | ICD-10-CM

## 2018-12-07 LAB — I-STAT BETA HCG BLOOD, ED (MC, WL, AP ONLY): I-stat hCG, quantitative: <5 m[IU]/mL (ref <5)

## 2018-12-07 LAB — CBC WITH DIFFERENTIAL/PLATELET
Abs Immature Granulocytes: 0.03 10*3/uL (ref 0.00–0.07)
Basophils Absolute: 0.1 10*3/uL (ref 0.0–0.1)
Basophils Relative: 1 %
Eosinophils Absolute: 0.3 10*3/uL (ref 0.0–0.5)
Eosinophils Relative: 2 %
HCT: 44.7 % (ref 36.0–46.0)
Hemoglobin: 14.8 g/dL (ref 12.0–15.0)
Immature Granulocytes: 0 %
Lymphocytes Relative: 28 %
Lymphs Abs: 3 10*3/uL (ref 0.7–4.0)
MCH: 30.8 pg (ref 26.0–34.0)
MCHC: 33.1 g/dL (ref 30.0–36.0)
MCV: 93.1 fL (ref 80.0–100.0)
Monocytes Absolute: 0.7 10*3/uL (ref 0.1–1.0)
Monocytes Relative: 7 %
Neutro Abs: 6.4 10*3/uL (ref 1.7–7.7)
Neutrophils Relative %: 62 %
Platelets: 293 10*3/uL (ref 150–400)
RBC: 4.8 MIL/uL (ref 3.87–5.11)
RDW: 12.3 % (ref 11.5–15.5)
WBC: 10.4 10*3/uL (ref 4.0–10.5)
nRBC: 0 % (ref 0.0–0.2)

## 2018-12-07 LAB — BASIC METABOLIC PANEL
Anion gap: 10 (ref 5–15)
BUN: 16 mg/dL (ref 6–20)
CO2: 21 mmol/L — ABNORMAL LOW (ref 22–32)
Calcium: 8.9 mg/dL (ref 8.9–10.3)
Chloride: 106 mmol/L (ref 98–111)
Creatinine, Ser: 0.89 mg/dL (ref 0.44–1.00)
GFR calc Af Amer: >60 mL/min (ref >60)
GFR calc non Af Amer: >60 mL/min (ref >60)
Glucose, Bld: 96 mg/dL (ref 70–99)
Potassium: 4.5 mmol/L (ref 3.5–5.1)
Sodium: 137 mmol/L (ref 135–145)

## 2018-12-07 LAB — I-STAT CREATININE, ED: Creatinine, Ser: 1.1 mg/dL — ABNORMAL HIGH (ref 0.44–1.00)

## 2018-12-07 MED ORDER — DIPHENHYDRAMINE HCL 50 MG/ML IJ SOLN
25.0000 mg | Freq: Once | INTRAMUSCULAR | Status: AC
  Administered 2018-12-07: 25 mg via INTRAVENOUS
  Filled 2018-12-07: qty 1

## 2018-12-07 MED ORDER — KETOROLAC TROMETHAMINE 15 MG/ML IJ SOLN
15.0000 mg | Freq: Once | INTRAMUSCULAR | Status: AC
  Administered 2018-12-07: 15 mg via INTRAVENOUS
  Filled 2018-12-07: qty 1

## 2018-12-07 MED ORDER — IOHEXOL 350 MG/ML SOLN
100.0000 mL | Freq: Once | INTRAVENOUS | Status: AC | PRN
  Administered 2018-12-07: 100 mL via INTRAVENOUS

## 2018-12-07 MED ORDER — SODIUM CHLORIDE 0.9 % IV BOLUS
1000.0000 mL | Freq: Once | INTRAVENOUS | Status: AC
  Administered 2018-12-07: 1000 mL via INTRAVENOUS

## 2018-12-07 MED ORDER — PROMETHAZINE HCL 25 MG/ML IJ SOLN
25.0000 mg | Freq: Once | INTRAMUSCULAR | Status: AC
  Administered 2018-12-07: 25 mg via INTRAVENOUS
  Filled 2018-12-07: qty 1

## 2018-12-07 NOTE — Telephone Encounter (Signed)
See earlier phone note from today. Called sister, Chong Sicilian who stated they have been taken into ED treatment area at [facility] now. I informed her Debra Hensley has FU with Dr Allayne Gitelman but if needed, he probably can see her tomorrow. Patty stated they will call us in the morning to update,  verbalized understanding, appreciation.

## 2018-12-07 NOTE — Telephone Encounter (Signed)
May setup sooner follow up or mychart visit with me. -VRP

## 2018-12-07 NOTE — Discharge Instructions (Addendum)
Please follow up with your neurologist tomorrow.  If your symptoms worsen or you have any new or concerning symptoms please seek additional medical care and evaluation.    Today your CT scan was very reassuring.  Today you received medications that may make you sleepy or impair your ability to make decisions.  For the next 24 hours please do not drive, operate heavy machinery, care for a small child with out another adult present, or perform any activities that may cause harm to you or someone else if you were to fall asleep or be impaired.

## 2018-12-07 NOTE — ED Notes (Signed)
To ct

## 2018-12-07 NOTE — ED Notes (Signed)
Iv stalled  Site retaped iv running again

## 2018-12-07 NOTE — Telephone Encounter (Signed)
Pt's sister Debra Hensley, not on DPR called stating that she has had to take the pt to the ED due to suffering really bad pain and tingling in both arms, legs and feet. She is having stabbing pain on the R side of neck that goes up to the crown of the head and effecting her face. Pt's sister would like to speak to the RN or the provider to discuss this. Pt can be reached on the sister's cell phone [phone]. She is on her way to a walk in clinic due to the fact that they have been waiting a very long in the ED without seeing anyone as of yet. Please advise.

## 2018-12-07 NOTE — Telephone Encounter (Signed)
Pt called in wanting to know if she can be seen today instead of 08/10 she states she is having pain on the left side of her face it shoots to the right side and to the top of here head, she states she feels as if there is a vein ripping.

## 2018-12-07 NOTE — Telephone Encounter (Signed)
Called patient, but her sister answered the phone. She stated she was taking patient to the ED, stated "she doesn't look good, her speech is slurred". She asked whether to go to [facility] or [facility]; I advised she go to [facility]. Sister verbalized understanding, appreciation of call and ended the call.

## 2018-12-07 NOTE — ED Triage Notes (Signed)
Patient complains of neck pain with any ROM x 2 days. States has some tingling to arms and legs with same. Patient denies injury. Pain resolved when laying, pain worse with ambulation

## 2018-12-07 NOTE — ED Provider Notes (Signed)
[facility] EMERGENCY DEPARTMENT Provider Note   CSN: [id number] Arrival date & time: 12/07/18  1323    History   Chief Complaint Chief Complaint  Patient presents with   Neck Pain    HPI Debra Hensley is a 38 y.o. female with past medical history of migraines, left-sided breast mass, left-sided facial trigeminal neuralgia, fibromyalgia, who presents today for evaluation of left-sided head and neck pain.  She reports that she has had a headache since Saturday.  She took her usual triptan along with her normal home medicines and that resolved her headache until Sunday night when it returned.  She reports that since Monday she has had a feeling of rhythm in the left side of her neck.  She states that she feels like she has sores on the left side of her head.  She denies any chest pain or shortness of breath.  She states that she is more dizzy than usual.  She follows with neurology and has recently had MRI that was normal.  She denies any injuries from her fall.  She denies any fevers or known sick contacts.  She reports tingling in both of her arms and legs that is at her baseline.     HPI  Past Medical History:  Diagnosis Date   Breast mass 01/2425   left   Complication of anesthesia    states had a problem with her heart rate during UHR, and was hard to wake up post-op; could not be more specific, but was not referred to cardiologist   Fibromyalgia    Migraine    Mood disorder ([place])    Obesity    Tachycardia    states heart rate has been high for last 3 doctor visits, has not been referred to cardiolgist or endocrinologist    Patient Active Problem List   Diagnosis Date Noted   Migraine with aura and without status migrainosus, not intractable 08/03/2017   Left breast mass 05/15/2014    Past Surgical History:  Procedure Laterality Date   BREAST EXCISIONAL BIOPSY Left    2015   BREAST LUMPECTOMY WITH RADIOACTIVE SEED LOCALIZATION Left  05/15/2014   Procedure: LEFT BREAST LUMPECTOMY WITH RADIOACTIVE SEED LOCALIZATION;  Surgeon: Fanny Skates, MD;  Location: [facility];  Service: General;  Laterality: Left;   CHOLECYSTECTOMY  08/01/2010   DILATION AND CURETTAGE OF UTERUS  01/06/2002   ELBOW FRACTURE SURGERY Right    age 89   NASAL TURBINATE REDUCTION     TONSILLECTOMY  8341   UMBILICAL HERNIA REPAIR  9622   UMBILICAL HERNIA REPAIR  2010     OB History   No obstetric history on file.      Home Medications    Prior to Admission medications   Medication Sig Start Date End Date Taking? Authorizing Provider  DULoxetine (CYMBALTA) 60 MG capsule Take 60 mg by mouth daily. 05/25/15  Yes [provider]  gabapentin (NEURONTIN) 300 MG capsule Take 300 mg by mouth daily.    Yes [provider]  ALPRAZolam Duanne Moron) 0.5 MG tablet Take on tablet one hour prior to MRI, then another when arrives at facility. Needs Driver. 06/30/18   Penumalli, Earlean Polka, MD  carbamazepine (TEGRETOL) 200 MG tablet Take 1-2 tablets (200-400 mg total) by mouth 2 (two) times daily. 06/13/18   Penumalli, Earlean Polka, MD  dicyclomine (BENTYL) 20 MG tablet Take 1 tablet (20 mg total) by mouth 2 (two) times daily. 03/01/18   Evette Cristal,  Lindsey A, PA-C  rizatriptan (MAXALT-MLT) 10 MG disintegrating tablet Take 1 tablet (10 mg total) by mouth as needed for migraine. May repeat in 2 hours if needed 06/13/18   Penumalli, Earlean Polka, MD  topiramate (TOPAMAX) 100 MG tablet Take 1 tablet (100 mg total) by mouth 2 (two) times daily. 06/13/18   Penumalli, Earlean Polka, MD  Vitamin D, Ergocalciferol, (DRISDOL) 50000 units CAPS capsule Take 50,000 Units by mouth 2 (two) times a week. 06/13/15   [provider]    Family History Family History  Problem Relation Age of Onset   Ovarian cancer Mother    Migraines Mother    Hypertension Mother    Migraines Son     Social History Social History   Tobacco Use   Smoking status: Never  Smoker   Smokeless tobacco: Never Used  Substance Use Topics   Alcohol use: Yes    Comment: rarely   Drug use: No     Allergies   Adhesive [tape], Hydrocodone, Codeine, and Percocet [oxycodone-acetaminophen]   Review of Systems Review of Systems  Constitutional: Negative for chills and fever.  HENT:       Left sided headache, facial pain, feeling of sores on head  Cardiovascular: Negative for chest pain.  Gastrointestinal: Negative for abdominal pain.  Musculoskeletal: Positive for neck pain. Negative for back pain.  Neurological: Positive for dizziness and headaches. Negative for speech difficulty and numbness.  All other systems reviewed and are negative.    Physical Exam Updated Vital Signs BP (!) 95/47    Pulse 72    Temp 98.3 F (36.8 C) (Oral)    Resp 14    SpO2 100%   Physical Exam Vitals signs and nursing note reviewed.  Constitutional:      General: She is not in acute distress.    Appearance: She is well-developed.     Comments: Tearful, appears uncomfortable  HENT:     Head: Normocephalic and atraumatic.     Comments: No sores on head where patient reports pain.     Right Ear: Tympanic membrane normal.     Left Ear: Tympanic membrane normal.     Mouth/Throat:     Mouth: Mucous membranes are moist.  Eyes:     Conjunctiva/sclera: Conjunctivae normal.  Neck:     Musculoskeletal: Normal range of motion and neck supple.     Comments: Diffuse TTP over trapezious muscles bilaterally.  Cardiovascular:     Rate and Rhythm: Normal rate and regular rhythm.     Heart sounds: No murmur.  Pulmonary:     Effort: Pulmonary effort is normal. No respiratory distress.     Breath sounds: Normal breath sounds.  Abdominal:     General: There is no distension.     Palpations: Abdomen is soft.     Tenderness: There is no abdominal tenderness.  Musculoskeletal:     Right lower leg: No edema.     Left lower leg: No edema.  Skin:    General: Skin is warm and dry.    Neurological:     Mental Status: She is alert and oriented to person, place, and time.     Comments: Exam limited by patient stating her head hurts limiting involvement and effort.  Mental Status:  Alert, oriented, thought content appropriate, able to give a coherent history. Speech fluent without evidence of aphasia. Able to follow 2 step commands without difficulty.  Cranial Nerves:  II:  Peripheral visual fields grossly normal, pupils equal,  round, reactive to light III,IV, VI: ptosis not present, extra-ocular motions intact bilaterally  V,VII: smile symmetric, facial light touch sensation equal VIII: hearing grossly normal to voice  X: uvula elevates symmetrically  XI: bilateral shoulder shrug symmetric and strong XII: midline tongue extension without fassiculations Motor:  Normal tone. 5/5 in upper and lower extremities bilaterally including strong and equal grip strength and dorsiflexion/plantar flexion Cerebellar: normal finger-to-nose with bilateral upper extremities Gait: - not tested CV: distal pulses palpable throughout    Psychiatric:        Mood and Affect: Mood normal.        Behavior: Behavior normal.      ED Treatments / Results  Labs (all labs ordered are listed, but only abnormal results are displayed) Labs Reviewed  BASIC METABOLIC PANEL - Abnormal; Notable for the following components:      Result Value   CO2 21 (*)    All other components within normal limits  I-STAT CREATININE, ED - Abnormal; Notable for the following components:   Creatinine, Ser 1.10 (*)    All other components within normal limits  CBC WITH DIFFERENTIAL/PLATELET  I-STAT BETA HCG BLOOD, ED ([facility], [facility], [facility] ONLY)    EKG None  Radiology Ct Angio Head W/cm &/or Wo Cm  Result Date: 12/07/2018 CLINICAL DATA:  Severe headache. Worst headache of life. Left neck pain EXAM: CT ANGIOGRAPHY HEAD AND NECK TECHNIQUE: Multidetector CT imaging of the head and neck was performed using the standard  protocol during bolus administration of intravenous contrast. Multiplanar CT image reconstructions and MIPs were obtained to evaluate the vascular anatomy. Carotid stenosis measurements (when applicable) are obtained utilizing NASCET criteria, using the distal internal carotid diameter as the denominator. CONTRAST:  159mL OMNIPAQUE IOHEXOL 350 MG/ML SOLN COMPARISON:  MRI head 07/05/2018 FINDINGS: CT HEAD FINDINGS Brain: No evidence of acute infarction, hemorrhage, hydrocephalus, extra-axial collection or mass lesion/mass effect. Vascular: Negative for hyperdense vessel. Skull: Negative Sinuses: Negative Orbits: Negative Review of the MIP images confirms the above findings CTA NECK FINDINGS Aortic arch: Standard branching. Imaged portion shows no evidence of aneurysm or dissection. No significant stenosis of the major arch vessel origins. Right carotid system: Normal right carotid. Negative for stenosis or dissection Left carotid system: Normal left carotid. Negative for stenosis or dissection. Vertebral arteries: Normal vertebral arteries bilaterally without stenosis or dissection. Skeleton: Negative Other neck: Negative for mass or adenopathy in the neck. Upper chest: Lung apices clear bilaterally. Review of the MIP images confirms the above findings CTA HEAD FINDINGS Anterior circulation: Cavernous carotid normal bilaterally. Anterior and middle cerebral arteries widely patent bilaterally without stenosis or aneurysm. Posterior circulation: Both vertebral arteries patent to the basilar. PICA patent bilaterally. Basilar widely patent. Superior cerebellar and posterior cerebral arteries patent bilaterally. Negative for aneurysm. Venous sinuses: Normal venous enhancement Anatomic variants: None Review of the MIP images confirms the above findings IMPRESSION: 1. Normal CT head 2. Normal CTA head and neck. Negative for stenosis or dissection. Negative for cerebral aneurysm. Electronically Signed   By: Franchot Gallo  M.D.   On: 12/07/2018 18:17   Ct Angio Neck W And/or Wo Contrast  Result Date: 12/07/2018 CLINICAL DATA:  Severe headache. Worst headache of life. Left neck pain EXAM: CT ANGIOGRAPHY HEAD AND NECK TECHNIQUE: Multidetector CT imaging of the head and neck was performed using the standard protocol during bolus administration of intravenous contrast. Multiplanar CT image reconstructions and MIPs were obtained to evaluate the vascular anatomy. Carotid stenosis measurements (when applicable) are  obtained utilizing NASCET criteria, using the distal internal carotid diameter as the denominator. CONTRAST:  13mL OMNIPAQUE IOHEXOL 350 MG/ML SOLN COMPARISON:  MRI head 07/05/2018 FINDINGS: CT HEAD FINDINGS Brain: No evidence of acute infarction, hemorrhage, hydrocephalus, extra-axial collection or mass lesion/mass effect. Vascular: Negative for hyperdense vessel. Skull: Negative Sinuses: Negative Orbits: Negative Review of the MIP images confirms the above findings CTA NECK FINDINGS Aortic arch: Standard branching. Imaged portion shows no evidence of aneurysm or dissection. No significant stenosis of the major arch vessel origins. Right carotid system: Normal right carotid. Negative for stenosis or dissection Left carotid system: Normal left carotid. Negative for stenosis or dissection. Vertebral arteries: Normal vertebral arteries bilaterally without stenosis or dissection. Skeleton: Negative Other neck: Negative for mass or adenopathy in the neck. Upper chest: Lung apices clear bilaterally. Review of the MIP images confirms the above findings CTA HEAD FINDINGS Anterior circulation: Cavernous carotid normal bilaterally. Anterior and middle cerebral arteries widely patent bilaterally without stenosis or aneurysm. Posterior circulation: Both vertebral arteries patent to the basilar. PICA patent bilaterally. Basilar widely patent. Superior cerebellar and posterior cerebral arteries patent bilaterally. Negative for aneurysm.  Venous sinuses: Normal venous enhancement Anatomic variants: None Review of the MIP images confirms the above findings IMPRESSION: 1. Normal CT head 2. Normal CTA head and neck. Negative for stenosis or dissection. Negative for cerebral aneurysm. Electronically Signed   By: Franchot Gallo M.D.   On: 12/07/2018 18:17    Procedures Procedures (including critical care time)  Medications Ordered in ED Medications  sodium chloride 0.9 % bolus 1,000 mL (0 mLs Intravenous Stopped 12/07/18 2127)  promethazine (PHENERGAN) injection 25 mg (25 mg Intravenous Given 12/07/18 1717)  iohexol (OMNIPAQUE) 350 MG/ML injection 100 mL (100 mLs Intravenous Contrast Given 12/07/18 1743)  ketorolac (TORADOL) 15 MG/ML injection 15 mg (15 mg Intravenous Given 12/07/18 1855)  diphenhydrAMINE (BENADRYL) injection 25 mg (25 mg Intravenous Given 12/07/18 1851)     Initial Impression / Assessment and Plan / ED Course  I have reviewed the triage vital signs and the nursing notes.  Pertinent labs & imaging results that were available during my care of the patient were reviewed by me and considered in my medical decision making (see chart for details).  Clinical Course as of Dec 06 2325  Wed Dec 07, 2018  1713 Called CT to get patient bumped up in line.  RN reports that patient is requesting something for pain.  She has Phenergan ordered for her migraine which has not yet been given.   [EH]  1910 Patient was reevaluated, her sister is in the room.  She reports that she is still having headache.  Discussed that we would most likely not be able to eliminate pain from her trigeminal neuralgia and fibromyalgia.  Given reassuring CT scan will add Toradol and Benadryl for migraine cocktail and reevaluate.   [EH]    Clinical Course User Index [EH] Lorin Glass, PA-C      Patient presents today for evaluation of worsening headache and facial pain.  She has a history of trigeminal neuralgia.  She reports that the migraine is  not unusual for her, however new symptom is feeling of tearing neck pain on the left side.  Neuro exam is limited secondary to her pain, however she is neurologically intact.  EKG without cause for her dizziness.  CT Angioof head and neck were obtained which did not show evidence of dissection or other acute abnormalities or cause for her symptoms.  She  has recently had normal MRI and follows with neurology.  She has a history of fibromyalgia which I suspect is contributing to her symptoms today.  Her headache was treated with saline, Phenergan, Benadryl, and Toradol after which she reported feeling better and wished to go home.  She has outpatient follow-up with her neurologist scheduled for tomorrow.  She does not have any chest pain or abdominal pain.  She does not have any significant hematologic or electrolyte derangements, and her pregnancy test is negative.  Patient was observed in the emergency room for multiple hours without worsening of condition.  Return precautions were discussed with patient who states their understanding.  At the time of discharge patient denied any unaddressed complaints or concerns.  Patient is agreeable for discharge home.   Final Clinical Impressions(s) / ED Diagnoses   Final diagnoses:  Neck pain  Nonintractable headache, unspecified chronicity pattern, unspecified headache type  Trigeminal neuralgia of left side of face    ED Discharge Orders    None       Lorin Glass, PA-C 12/07/18 [place], [place], DO 12/07/18 2331

## 2018-12-07 NOTE — ED Notes (Signed)
States she has a history of trigeminal neuralgia on the left side. States she has a lesion on the right side of the brain. Onset of headache Sat. Facial pain on Sunday, states she feels like she has sores in her head. C/o pain left lateral neck area. Denies injury. Takes medications at home for same however they aren't working.

## 2018-12-08 ENCOUNTER — Encounter: Payer: Self-pay | Admitting: Diagnostic Neuroimaging

## 2018-12-08 ENCOUNTER — Telehealth (INDEPENDENT_AMBULATORY_CARE_PROVIDER_SITE_OTHER): Payer: BLUE CROSS/BLUE SHIELD | Admitting: Diagnostic Neuroimaging

## 2018-12-08 DIAGNOSIS — R51 Headache: Secondary | ICD-10-CM | POA: Diagnosis not present

## 2018-12-08 DIAGNOSIS — R519 Headache, unspecified: Secondary | ICD-10-CM

## 2018-12-08 DIAGNOSIS — G890 Central pain syndrome: Secondary | ICD-10-CM | POA: Diagnosis not present

## 2018-12-08 MED ORDER — LAMOTRIGINE 150 MG PO TABS: 150.0000 mg | ORAL_TABLET | Freq: Every day | ORAL | 12 refills | Status: DC

## 2018-12-08 NOTE — Telephone Encounter (Signed)
Pt mother has called to inquire about the time pt will be able to come in today to be seen due to pain.  Please call pt(mother aware she is not on DPR)

## 2018-12-08 NOTE — Progress Notes (Signed)
    Virtual Visit via Video Note  I connected with Debra Hensley on 12/08/18 at  1:45 PM EDT by a video enabled telemedicine application and verified that I am speaking with the correct person using two identifiers.   I discussed the limitations of evaluation and management by telemedicine and the availability of in person appointments. The patient expressed understanding and agreed to proceed.  Patient is at their home. I am at the office.    History of Present Illness:  - had recurrence of severe left facial and left eye pain; explosive feeling; left neck pain. - went to the ER yesterday, had CTA head / neck, unremarkable. - patient had stopped CBZ May 2020; then switched to lamotrigine in June 2020; then ran out of lamotrigine on 12/02/18; then HA and pain started on 12/04/18    Observations/Objective:  VIDEO EXAM  GENERAL EXAM/CONSTITUTIONAL:  Vitals: There were no vitals filed for this visit.  There is no height or weight on file to calculate BMI. Wt Readings from Last 3 Encounters:  06/13/18 212 lb (96.2 kg)  02/28/18 214 lb (97.1 kg)  02/15/18 212 lb (96.2 kg)     Patient is in no distress; well developed, nourished and groomed; neck is supple   NEUROLOGIC: MENTAL STATUS:  No flowsheet data found.  awake, alert, oriented to person, place and time  recent and remote memory intact  normal attention and concentration  language fluent, comprehension intact, naming intact  fund of knowledge appropriate  CRANIAL NERVE:   2nd, 3rd, 4th, 6th - visual fields full to confrontation, extraocular muscles intact, no nystagmus  5th - facial sensation symmetric  7th - facial strength symmetric  8th - hearing intact  11th - shoulder shrug symmetric  12th - tongue protrusion midline  MOTOR:   NO TREMOR; NO DRIFT IN BUE  SENSORY:   normal and symmetric to light touch  COORDINATION:   fine finger movements normal   07/05/18 MRI brain [I reviewed images  myself and agree with interpretation. -VRP]  - Stable small (54mm) focus of right thalamic gliosis. No change from MRI on 02/13/18.      Assessment and Plan:  Dx:  1. Thalamic pain syndrome   2. Left facial pain    - restart lamotrigine 150mg  daily (may help bipolar and central pain syndrome) - may consider repeat MRI in 1 year for follow up of thalamic lesion; may consider LP and add'l workup in future  Meds ordered this encounter  Medications  . lamoTRIgine (LAMICTAL) 150 MG tablet    Sig: Take 1 tablet (150 mg total) by mouth daily.    Dispense:  30 tablet    Refill:  12     Follow Up Instructions:  - Return in about 3 months (around 03/10/2019).    I discussed the assessment and treatment plan with the patient. The patient was provided an opportunity to ask questions and all were answered. The patient agreed with the plan and demonstrated an understanding of the instructions.   The patient was advised to call back or seek an in-person evaluation if the symptoms worsen or if the condition fails to improve as anticipated.  I provided 15 minutes of non-face-to-face time during this encounter.   Penni Bombard, MD 09/04/6501, 5:46 PM Certified in Neurology, Neurophysiology and Neuroimaging  [facility] [address] [place], [place] [address] [phone]

## 2018-12-08 NOTE — Telephone Encounter (Signed)
Called patient to offer my chart video visit. She stated she would try to get on my chart. Visit added for today, 1:45 pm.

## 2018-12-12 ENCOUNTER — Ambulatory Visit: Payer: Self-pay | Admitting: Diagnostic Neuroimaging

## 2018-12-19 ENCOUNTER — Other Ambulatory Visit: Payer: Self-pay

## 2018-12-19 MED ORDER — LAMOTRIGINE 100 MG PO TABS: 100.0000 mg | ORAL_TABLET | Freq: Two times a day (BID) | ORAL | 12 refills | Status: DC

## 2018-12-19 NOTE — Telephone Encounter (Signed)
Increase lamotrigine to 100mg  twice a day. -VRP

## 2018-12-19 NOTE — Addendum Note (Signed)
Addended by: Andrey Spearman R on: 12/19/2018 04:49 PM   Modules accepted: Orders

## 2018-12-19 NOTE — Telephone Encounter (Signed)
Pt was put on Lamotrigine 2 weeks ago and was advised to call if she felt it needed to be increased and she states that she had another episode and believes that it needs to be increased. She is also almost out of the Gabapentin 300mg  daily, pharmacy confirmed. She also feels that a follow up appt is needed sooner than 3 months.

## 2018-12-20 NOTE — Telephone Encounter (Addendum)
Spoke with patient and informed her Dr Leta Baptist has increased Lamotrigine to 100 mg twice daily, Rx sent in yesterday. She needs to contact prescriber for gabapentin refill. Advised she needs to try increased dose of lamotrigine for 1-2 weeks. If no improvement we will schedule her to see NP for a sooner FU. She reported a new symptom of scalp sensitivity for the past 2 weeks. She stated she will try increased dose, call us for any problems, concerns or if she isn't better. She verbalized understanding, appreciation.

## 2018-12-26 DIAGNOSIS — Z30433 Encounter for removal and reinsertion of intrauterine contraceptive device: Secondary | ICD-10-CM | POA: Diagnosis not present

## 2018-12-26 DIAGNOSIS — Z130 Encounter for screening for diseases of the blood and blood-forming organs and certain disorders involving the immune mechanism: Secondary | ICD-10-CM | POA: Diagnosis not present

## 2018-12-26 DIAGNOSIS — Z3202 Encounter for pregnancy test, result negative: Secondary | ICD-10-CM | POA: Diagnosis not present

## 2018-12-26 DIAGNOSIS — Z01419 Encounter for gynecological examination (general) (routine) without abnormal findings: Secondary | ICD-10-CM | POA: Diagnosis not present

## 2018-12-26 DIAGNOSIS — Z124 Encounter for screening for malignant neoplasm of cervix: Secondary | ICD-10-CM | POA: Diagnosis not present

## 2018-12-26 DIAGNOSIS — R32 Unspecified urinary incontinence: Secondary | ICD-10-CM | POA: Diagnosis not present

## 2018-12-27 DIAGNOSIS — Z124 Encounter for screening for malignant neoplasm of cervix: Secondary | ICD-10-CM | POA: Diagnosis not present

## 2019-01-17 ENCOUNTER — Telehealth: Payer: Self-pay | Admitting: Diagnostic Neuroimaging

## 2019-01-17 NOTE — Telephone Encounter (Signed)
Pt called stating that her pain is really bad and it has been this way since yesterday and she is wanting RN to call her to discuss what she can do to help with the pain. Please advise.

## 2019-01-17 NOTE — Telephone Encounter (Signed)
Called patient who stated her trigeminal pain started again Sun night, is recurring,  severe pain, happening at least once a week. New problems are scalp sensitivity- "feels like someone has scraped top of scalp off", tingling in feet, hands, and some new back pain. She has had no changes in medications, has been taking as prescribed. She would like to know what to do for relief, and we scheduled her for FU with NP next week as well.

## 2019-01-17 NOTE — Telephone Encounter (Signed)
Called patient and advised her of Dr Gladstone Lighter recommendation to increase gabapentin to 300 mg twice a day; she'll call prescribing provider for new Rx. Advised Dr Leta Baptist stated she may restart carabamazepine. She stated she would like to do that, verbalized understanding, appreciation. Per Dr Leta Baptist called Suzie Portela, [facility] [address] and left verbal Rx for carbamazepine.

## 2019-01-17 NOTE — Telephone Encounter (Signed)
-   may increase gabapentin to 300mg  twice a day; may also restart carbamazepine to 200mg  twice a day.  Penni Bombard, MD [id number], [id number] PM Certified in Neurology, Neurophysiology and Neuroimaging  [facility] [address] [place], [place] [address] [phone]

## 2019-01-25 ENCOUNTER — Encounter: Payer: Self-pay | Admitting: Family Medicine

## 2019-01-25 ENCOUNTER — Ambulatory Visit (INDEPENDENT_AMBULATORY_CARE_PROVIDER_SITE_OTHER): Payer: BLUE CROSS/BLUE SHIELD | Admitting: Family Medicine

## 2019-01-25 ENCOUNTER — Other Ambulatory Visit: Payer: Self-pay

## 2019-01-25 VITALS — BP 124/78 | HR 80 | Temp 98.7°F | Ht 63.0 in | Wt 209.6 lb

## 2019-01-25 DIAGNOSIS — R200 Anesthesia of skin: Secondary | ICD-10-CM | POA: Diagnosis not present

## 2019-01-25 DIAGNOSIS — R51 Headache: Secondary | ICD-10-CM | POA: Diagnosis not present

## 2019-01-25 DIAGNOSIS — G43109 Migraine with aura, not intractable, without status migrainosus: Secondary | ICD-10-CM | POA: Diagnosis not present

## 2019-01-25 DIAGNOSIS — R519 Headache, unspecified: Secondary | ICD-10-CM

## 2019-01-25 MED ORDER — GABAPENTIN 300 MG PO CAPS: 300.0000 mg | ORAL_CAPSULE | Freq: Three times a day (TID) | ORAL | 11 refills | Status: DC

## 2019-01-25 MED ORDER — LAMOTRIGINE 150 MG PO TABS: 150.0000 mg | ORAL_TABLET | Freq: Every day | ORAL | Status: DC

## 2019-01-25 NOTE — Progress Notes (Signed)
PATIENT: Debra Hensley DOB: 02-01-1981  REASON FOR VISIT: follow up HISTORY FROM: patient  Chief Complaint  Patient presents with  . Follow-up    Alone. Rm 8. Patient mentioned that she is having pain at the top of her head that is non-stop. She stated that its painful to touch her head or to brush her hair. She stated that started 5-6 weeks ago.      HISTORY OF PRESENT ILLNESS: Today 01/25/19 Debra Hensley is a 38 y.o. female here today for follow up for migraines and left facial pain. She continues to have left facial pain. Pain starts in neck just below her left ear and radiates up to the left cheek, nose and eye. Episodes are more frequent. They last 2-3 days. She also reports new pain in the top of her scalp that is described as a burning sensation across bilateral parietal and occipital regions of head. No rash noted. She has checked with hair dresser who did not see any concerns. She was taking gabapentin 300mg  daily prescribed by primary care for pain. She has been out of this medication for about a week. She reports PCP was not comfortable increasing gabapentin dose as recommended by Dr Leta Baptist. She did start carbamazepine twice daily and feels that this has helped some.  She is also taking lamotrigine 150 mg daily.  She has fibromyalgia treated with duloxetine 60mg  daily.   HISTORY: (copied from Dr Gladstone Lighter note on 06/13/2018)  UPDATE (06/13/18, VRP): Since last visit, doing poorly. More LEFT facial pain (not right side as previously indicated; patient corrects this fact for [place] on this visit). Symptoms last hours - days at a time. Has poor sleep. Symptoms are progressive. CBZ not helping. Feels tired all the time. No alleviating or aggravating factors.  Migraine are improved (1 per month now).   UPDATE (02/15/18, VRP): Since last visit, doing poorly. Symptoms are worsening and severe. Avg 1-2 migraine per month and rizatriptan helps. Right facial pain worsening, constant,  severe. No alleviating or aggravating factors.    UPDATE (08/03/17, VRP): Since last visit, patient continues to have migraines approximately 1-2/month.  However patient has also developed a different kind of pain problem for past 3 years, worse in the last 6-12 months, with sudden sharp pain in her right shoulder radiating to her right neck, right face and right eye.  Sometimes pain radiates down her right arm and has some numbness and tingling.  Sometimes has photophobia and phonophobia.  Pain is very severe and can last all day.  Patient also has some scalp sensitivity with this problem.    Patient continues to have significant sleep disturbance, averaging 3-4 hours of sleep per night.  This is related to insomnia and anxiety feelings as well as crowded bedroom where most nights 2 children and their family dog sleep in their bed.  UPDATE 08/02/15: Since last visit, now on TPX + sumatriptan. Still 1 HA every other week. Last week had migraine lasting 4 days. No triggers noted for HA. Int numbness still occuring.   PRIOR HPI 06/21/15: 38 year old right-handed female here for evaluation of numbness and tingling. Symptoms have been present for past 2-3 months. She describes migratory right greater than left side, arms, hands, legs and feet numbness and tingling sensation. Symptoms last minutes at a time can occur multiple times a day. She has these sensations approximately 10 days per month. Symptoms are particularly noticeable during migraine headache attacks. Patient has had migraine headaches since age  63 years old. She has a warning of scintillating visual phenomenon, followed by severe global headache with neck pain, shoulder pain, nausea, photophobia and phonophobia. When she was younger she would pass out from severe headaches. For most of her life she was having 1 migraine headache every few months. Over past few years this has increased to at least one migraine attack per month, which can last at  least 2 days. In the last few months she has been averaging 2-4 days of headache per month. She's never been on preventative medication. She has been on Imitrex 100 mg as needed for rescue medication. This seems to work well. She also takes [phone] mg of Tylenol every day for past few months. Patient also has history of depression, anxiety and fibromyalgia. She is on Cymbalta, gabapentin and Vicodin as needed. No other specific triggering factors, change in stress, diet, exercise, sleep. She does have chronic fatigue in spite of adequate rest at nighttime. She had a sleep study a few years ago which apparently was negative for sleep apnea although she did not sleep well during the study.   REVIEW OF SYSTEMS: Out of a complete 14 system review of symptoms, the patient complains only of the following symptoms, headaches, numbness, tingling, anxiety and all other reviewed systems are negative.  ALLERGIES: Allergies  Allergen Reactions  . Adhesive [Tape] Other (See Comments)    BLISTERS  . Hydrocodone Itching  . Codeine Itching  . Percocet [Oxycodone-Acetaminophen] Itching    HOME MEDICATIONS: Outpatient Medications Prior to Visit  Medication Sig Dispense Refill  . carbamazepine (TEGRETOL) 200 MG tablet Take 200 mg by mouth 2 (two) times daily.    . DULoxetine (CYMBALTA) 60 MG capsule Take 60 mg by mouth daily.  5  . lamoTRIgine (LAMICTAL) 100 MG tablet Take 1 tablet (100 mg total) by mouth 2 (two) times daily. 60 tablet 12  . rizatriptan (MAXALT-MLT) 10 MG disintegrating tablet Take 1 tablet (10 mg total) by mouth as needed for migraine. May repeat in 2 hours if needed 9 tablet 11  . topiramate (TOPAMAX) 100 MG tablet Take 1 tablet (100 mg total) by mouth 2 (two) times daily. 60 tablet 12  . gabapentin (NEURONTIN) 300 MG capsule Take 300 mg by mouth daily.     . Vitamin D, Ergocalciferol, (DRISDOL) 50000 units CAPS capsule Take 50,000 Units by mouth 2 (two) times a week.  0   No  facility-administered medications prior to visit.     PAST MEDICAL HISTORY: Past Medical History:  Diagnosis Date  . Breast mass 05/2014   left  . Complication of anesthesia    states had a problem with her heart rate during UHR, and was hard to wake up post-op; could not be more specific, but was not referred to cardiologist  . Fibromyalgia   . Migraine   . Mood disorder ([place])   . Obesity   . Tachycardia    states heart rate has been high for last 3 doctor visits, has not been referred to cardiolgist or endocrinologist    PAST SURGICAL HISTORY: Past Surgical History:  Procedure Laterality Date  . BREAST EXCISIONAL BIOPSY Left    2015  . BREAST LUMPECTOMY WITH RADIOACTIVE SEED LOCALIZATION Left 05/15/2014   Procedure: LEFT BREAST LUMPECTOMY WITH RADIOACTIVE SEED LOCALIZATION;  Surgeon: Fanny Skates, MD;  Location: [facility];  Service: General;  Laterality: Left;  . CHOLECYSTECTOMY  08/01/2010  . DILATION AND CURETTAGE OF UTERUS  01/06/2002  . ELBOW FRACTURE  SURGERY Right    age 77  . NASAL TURBINATE REDUCTION    . TONSILLECTOMY  1989  . UMBILICAL HERNIA REPAIR  2010  . UMBILICAL HERNIA REPAIR  2010    FAMILY HISTORY: Family History  Problem Relation Age of Onset  . Ovarian cancer Mother   . Migraines Mother   . Hypertension Mother   . Migraines Son     SOCIAL HISTORY: Social History   Socioeconomic History  . Marital status: Married    Spouse name: Legrand Como  . Number of children: 3  . Years of education: 30  . Highest education level: Not on file  Occupational History  . Occupation: Stay at home  Social Needs  . Financial resource strain: Not on file  . Food insecurity    Worry: Not on file    Inability: Not on file  . Transportation needs    Medical: Not on file    Non-medical: Not on file  Tobacco Use  . Smoking status: Never Smoker  . Smokeless tobacco: Never Used  Substance and Sexual Activity  . Alcohol use: Yes    Comment: rarely   . Drug use: No  . Sexual activity: Yes    Birth control/protection: I.U.D.  Lifestyle  . Physical activity    Days per week: Not on file    Minutes per session: Not on file  . Stress: Not on file  Relationships  . Social Herbalist on phone: Not on file    Gets together: Not on file    Attends religious service: Not on file    Active member of club or organization: Not on file    Attends meetings of clubs or organizations: Not on file    Relationship status: Not on file  . Intimate partner violence    Fear of current or ex partner: Not on file    Emotionally abused: Not on file    Physically abused: Not on file    Forced sexual activity: Not on file  Other Topics Concern  . Not on file  Social History Narrative   Lives at home with husband and 3 kids   Education -    Caffeine use: 1 cup daily      PHYSICAL EXAM  Vitals:   01/25/19 1403  BP: 124/78  Pulse: 80  Temp: 98.7 F (37.1 C)  TempSrc: Oral  Weight: 209 lb 9.6 oz (95.1 kg)  Height: 5\' 3"  (1.6 m)   Body mass index is 37.13 kg/m.  Generalized: Well developed, in no acute distress  Cardiology: normal rate and rhythm, no murmur noted Neurological examination  Mentation: Alert oriented to time, place, history taking. Follows all commands speech and language fluent Cranial nerve II-XII: Pupils were equal round reactive to light. Extraocular movements were full, visual field were full on confrontational test. Facial sensation and strength were normal. Uvula tongue midline. Head turning and shoulder shrug  were normal and symmetric. Motor: The motor testing reveals 5 over 5 strength of all 4 extremities. Good symmetric motor tone is noted throughout.  Sensory: Sensory testing is intact to soft touch on all 4 extremities. No evidence of extinction is noted.  Coordination: Cerebellar testing reveals good finger-nose-finger and heel-to-shin bilaterally.  Gait and station: Gait is normal.   DIAGNOSTIC DATA  (LABS, IMAGING, TESTING) - I reviewed patient records, labs, notes, testing and imaging myself where available.  No flowsheet data found.   Lab Results  Component Value Date  WBC 10.4 12/07/2018   HGB 14.8 12/07/2018   HCT 44.7 12/07/2018   MCV 93.1 12/07/2018   PLT 293 12/07/2018      Component Value Date/Time   NA 137 12/07/2018 1621   K 4.5 12/07/2018 1621   CL 106 12/07/2018 1621   CO2 21 (L) 12/07/2018 1621   GLUCOSE 96 12/07/2018 1621   BUN 16 12/07/2018 1621   CREATININE 1.10 (H) 12/07/2018 1625   CALCIUM 8.9 12/07/2018 1621   PROT 7.5 02/28/2018 1815   ALBUMIN 4.2 02/28/2018 1815   AST 19 02/28/2018 1815   ALT 28 02/28/2018 1815   ALKPHOS 73 02/28/2018 1815   BILITOT 0.4 02/28/2018 1815   GFRNONAA >60 12/07/2018 1621   GFRAA >60 12/07/2018 1621   No results found for: CHOL, HDL, LDLCALC, LDLDIRECT, TRIG, CHOLHDL Lab Results  Component Value Date   HGBA1C 5.5 07/05/2018   Lab Results  Component Value Date   VITAMINB12 486 07/05/2018   Lab Results  Component Value Date   TSH 0.612 07/05/2018     ASSESSMENT AND PLAN 38 y.o. year old female  has a past medical history of Breast mass ([id number]), Complication of anesthesia, Fibromyalgia, Migraine, Mood disorder ([place]), Obesity, and Tachycardia. here with     ICD-10-CM   1. Migraine with aura and without status migrainosus, not intractable  G43.109   2. Left facial pain  R51   3. Numbness  R20.0     Kalley continues to have pain in her trigeminal distribution as well as other pain that is nonspecific.  I suspect that fibromyalgia could be contributing.  She will continue carbamazepine 200 mg twice daily and lamotrigine 150 mg daily.  I will call in gabapentin 300 mg up to 3 times daily.  She was advised to start 300 mg twice daily to ensure tolerability.  She will continue close follow-up with primary care for fibromyalgia symptoms.  She will follow-up in 1 year, sooner if needed.  She verbalizes understanding  and agreement with this plan.   No orders of the defined types were placed in this encounter.    Meds ordered this encounter  Medications  . gabapentin (NEURONTIN) 300 MG capsule    Sig: Take 1 capsule (300 mg total) by mouth 3 (three) times daily. Start 300mg  twice daily for 1-2 weeks, then increase to 300mg  three times daily as needed.    Dispense:  90 capsule    Refill:  11    Order Specific Question:   Supervising Provider    Answer:   Melvenia Beam [id number]      I spent 15 minutes with the patient. 50% of this time was spent counseling and educating patient on plan of care and medications.    Debbora Presto, FNP-C 01/25/2019, 4:37 PM [facility] [address] [place], [place] [address] [phone]

## 2019-01-25 NOTE — Patient Instructions (Signed)
Continue carbazepine 200mg  twice daily and lamotrigine 150mg  daily  We will restart gabapentin 300mg  up to three times daily. Start with twice daily dosing, then increase slowly as tolerated.   Follow PCP closely for fibromyalgia   Follow up in 1 year, sooner if needed   Trigeminal Neuralgia  Trigeminal neuralgia is a nerve disorder that causes severe pain on one side of the face. The pain may last from a few seconds to several minutes. The pain is usually only on one side of the face. Symptoms may occur for days, weeks, or months and then go away for months or years. The pain may return and be worse than before. What are the causes? This condition is caused by damage or pressure to a nerve in the head that is called the trigeminal nerve. An attack can be triggered by:  Talking.  Chewing.  Putting on makeup.  Washing your face.  Shaving your face.  Brushing your teeth.  Touching your face. What increases the risk? You are more likely to develop this condition if you:  Are 72 years of age or older.  Are female. What are the signs or symptoms? The main symptom of this condition is severe pain in the:  Jaw.  Lips.  Eyes.  Nose.  Scalp.  Forehead.  Face. The pain may be:  Intense.  Stabbing.  Electric.  Shock-like. How is this diagnosed? This condition is diagnosed with a physical exam. A CT scan or an MRI may be done to rule out other conditions that can cause facial pain. How is this treated? This condition may be treated with:  Avoiding the things that trigger your symptoms.  Taking prescription medicines (anticonvulsants).  Having surgery. This may be done in severe cases if other medical treatment does not provide relief.  Having procedures such as ablation, thermal, or radiation therapy. It may take up to one month for treatment to start relieving the pain. Follow these instructions at home: Managing pain  Learn as much as you can about how  to manage your pain. Ask your health care provider if a pain specialist would be helpful.  Consider talking with a mental health care provider (psychologist) about how to cope with the pain.  Consider joining a pain support group. General instructions  Take over-the-counter and prescription medicines only as told by your health care provider.  Avoid the things that trigger your symptoms. It may help to: ? Chew on the unaffected side of your mouth. ? Avoid touching your face. ? Avoid blasts of hot or cold air.  Follow your treatment plan as told by your health care provider. This may include: ? Cognitive or behavioral therapy. ? Gentle, regular exercise. ? Meditation or yoga. ? Aromatherapy.  Keep all follow-up visits as told by your health care provider. You may need to be monitored closely to make sure treatment is working well for you. Where to find more information  Facial Pain Association: fpa-support.org Contact a health care provider if:  Your medicine is not helping your symptoms.  You have side effects from the medicine used for treatment.  You develop new, unexplained symptoms, such as: ? Double vision. ? Facial weakness. ? Facial numbness. ? Changes in hearing or balance.  You feel depressed. Get help right away if:  Your pain is severe and is not getting better.  You develop suicidal thoughts. If you ever feel like you may hurt yourself or others, or have thoughts about taking your own life, get  help right away. You can go to your nearest emergency department or call:  Your local emergency services (911 in the [place]  A suicide crisis helpline, such as the [place] at 323-452-1586. This is open 24 hours a day. Summary  Trigeminal neuralgia is a nerve disorder that causes severe pain on one side of the face. The pain may last from a few seconds to several minutes.  This condition is caused by damage or pressure to a nerve in  the head that is called the trigeminal nerve.  Treatment may include avoiding the things that trigger your symptoms, taking medicines, or having surgery or procedures. It may take up to one month for treatment to start relieving the pain.  Avoid the things that trigger your symptoms.  Keep all follow-up visits as told by your health care provider. You may need to be monitored closely to make sure treatment is working well for you. This information is not intended to replace advice given to you by your health care provider. Make sure you discuss any questions you have with your health care provider. Document Released: 04/17/2000 Document Revised: 03/07/2018 Document Reviewed: 03/07/2018 Elsevier Patient Education  2020 [place].   Neuropathic Pain Neuropathic pain is pain caused by damage to the nerves that are responsible for certain sensations in your body (sensory nerves). The pain can be caused by:  Damage to the sensory nerves that send signals to your spinal cord and brain (peripheral nervous system).  Damage to the sensory nerves in your brain or spinal cord (central nervous system). Neuropathic pain can make you more sensitive to pain. Even a minor sensation can feel very painful. This is usually a long-term condition that can be difficult to treat. The type of pain differs from person to person. It may:  Start suddenly (acute), or it may develop slowly and last for a long time (chronic).  Come and go as damaged nerves heal, or it may stay at the same level for years.  Cause emotional distress, loss of sleep, and a lower quality of life. What are the causes? The most common cause of this condition is diabetes. Many other diseases and conditions can also cause neuropathic pain. Causes of neuropathic pain can be classified as:  Toxic. This is caused by medicines and chemicals. The most common cause of toxic neuropathic pain is damage from cancer treatments  (chemotherapy).  Metabolic. This can be caused by: ? Diabetes. This is the most common disease that damages the nerves. ? Lack of vitamin B from long-term alcohol abuse.  Traumatic. Any injury that cuts, crushes, or stretches a nerve can cause damage and pain. A common example is feeling pain after losing an arm or leg (phantom limb pain).  Compression-related. If a sensory nerve gets trapped or compressed for a long period of time, the blood supply to the nerve can be cut off.  Vascular. Many blood vessel diseases can cause neuropathic pain by decreasing blood supply and oxygen to nerves.  Autoimmune. This type of pain results from diseases in which the body's defense system (immune system) mistakenly attacks sensory nerves. Examples of autoimmune diseases that can cause neuropathic pain include lupus and multiple sclerosis.  Infectious. Many types of viral infections can damage sensory nerves and cause pain. Shingles infection is a common cause of this type of pain.  Inherited. Neuropathic pain can be a symptom of many diseases that are passed down through families (genetic). What increases the risk? You are  more likely to develop this condition if:  You have diabetes.  You smoke.  You drink too much alcohol.  You are taking certain medicines, including medicines that kill cancer cells (chemotherapy) or that treat immune system disorders. What are the signs or symptoms? The main symptom is pain. Neuropathic pain is often described as:  Burning.  Shock-like.  Stinging.  Hot or cold.  Itching. How is this diagnosed? No single test can diagnose neuropathic pain. It is diagnosed based on:  Physical exam and your symptoms. Your health care provider will ask you about your pain. You may be asked to use a pain scale to describe how bad your pain is.  Tests. These may be done to see if you have a high sensitivity to pain and to help find the cause and location of any sensory  nerve damage. They include: ? Nerve conduction studies to test how well nerve signals travel through your sensory nerves (electrodiagnostic testing). ? Stimulating your sensory nerves through electrodes on your skin and measuring the response in your spinal cord and brain (somatosensory evoked potential).  Imaging studies, such as: ? X-rays. ? CT scan. ? MRI. How is this treated? Treatment for neuropathic pain may change over time. You may need to try different treatment options or a combination of treatments. Some options include:  Treating the underlying cause of the neuropathy, such as diabetes, kidney disease, or vitamin deficiencies.  Stopping medicines that can cause neuropathy, such as chemotherapy.  Medicine to relieve pain. Medicines may include: ? Prescription or over-the-counter pain medicine. ? Anti-seizure medicine. ? Antidepressant medicines. ? Pain-relieving patches that are applied to painful areas of skin. ? A medicine to numb the area (local anesthetic), which can be injected as a nerve block.  Transcutaneous nerve stimulation. This uses electrical currents to block painful nerve signals. The treatment is painless.  Alternative treatments, such as: ? Acupuncture. ? Meditation. ? Massage. ? Physical therapy. ? Pain management programs. ? Counseling. Follow these instructions at home: Medicines   Take over-the-counter and prescription medicines only as told by your health care provider.  Do not drive or use heavy machinery while taking prescription pain medicine.  If you are taking prescription pain medicine, take actions to prevent or treat constipation. Your health care provider may recommend that you: ? Drink enough fluid to keep your urine pale yellow. ? Eat foods that are high in fiber, such as fresh fruits and vegetables, whole grains, and beans. ? Limit foods that are high in fat and processed sugars, such as fried or sweet foods. ? Take an  over-the-counter or prescription medicine for constipation. Lifestyle   Have a good support system at home.  Consider joining a chronic pain support group.  Do not use any products that contain nicotine or tobacco, such as cigarettes and e-cigarettes. If you need help quitting, ask your health care provider.  Do not drink alcohol. General instructions  Learn as much as you can about your condition.  Work closely with all your health care providers to find the treatment plan that works best for you.  Ask your health care provider what activities are safe for you.  Keep all follow-up visits as told by your health care provider. This is important. Contact a health care provider if:  Your pain treatments are not working.  You are having side effects from your medicines.  You are struggling with tiredness (fatigue), mood changes, depression, or anxiety. Summary  Neuropathic pain is pain caused by  damage to the nerves that are responsible for certain sensations in your body (sensory nerves).  Neuropathic pain may come and go as damaged nerves heal, or it may stay at the same level for years.  Neuropathic pain is usually a long-term condition that can be difficult to treat. Consider joining a chronic pain support group. This information is not intended to replace advice given to you by your health care provider. Make sure you discuss any questions you have with your health care provider. Document Released: 01/16/2004 Document Revised: 08/11/2018 Document Reviewed: 05/07/2017 Elsevier Patient Education  2020 [employer].

## 2019-02-27 NOTE — Progress Notes (Signed)
I reviewed note and agree with plan.   Penni Bombard, MD [id number], 0000000 AM Certified in Neurology, Neurophysiology and Neuroimaging  [facility] [address], [place], [place] [address] [phone]

## 2019-05-16 DIAGNOSIS — M797 Fibromyalgia: Secondary | ICD-10-CM | POA: Diagnosis not present

## 2019-05-16 DIAGNOSIS — G43109 Migraine with aura, not intractable, without status migrainosus: Secondary | ICD-10-CM | POA: Diagnosis not present

## 2019-05-16 DIAGNOSIS — G500 Trigeminal neuralgia: Secondary | ICD-10-CM | POA: Diagnosis not present

## 2019-07-19 ENCOUNTER — Telehealth: Payer: Self-pay | Admitting: Family Medicine

## 2019-07-19 DIAGNOSIS — G43009 Migraine without aura, not intractable, without status migrainosus: Secondary | ICD-10-CM | POA: Diagnosis not present

## 2019-07-19 DIAGNOSIS — G500 Trigeminal neuralgia: Secondary | ICD-10-CM | POA: Diagnosis not present

## 2019-07-19 NOTE — Telephone Encounter (Signed)
Is PCP treating her for this acutely? If not, I would recommend prednisone taper 10mg  tablets with 60mg  day 1, 50mg  day 2, 40mg  day 3, 30mg  day 4, 20mg  day 5 and 10mg  on day 6. She should continue gabapentin 300mg  three times daily but can take an extra dose if needed.

## 2019-07-19 NOTE — Telephone Encounter (Signed)
I called pt and could not reach her.  Could not LM. Spoke to husband and they will have daughter call us back.

## 2019-07-19 NOTE — Telephone Encounter (Signed)
Pt called stating that she is having a trigeminal neuralgia flare up and is wanting to know if she can come in to get a shot. Please advise.

## 2019-07-19 NOTE — Telephone Encounter (Signed)
I called pt and she is having L facial flare, started Monday. Progressively gotten worse. Is in extreme pain now and is at pcp office. I went over her medications and she is taking as listed.  ? Infusion steroids? (she stated that Dr. Leta Baptist stated.  I would let AL/NP know and call her back.

## 2019-07-20 NOTE — Telephone Encounter (Signed)
I called pt and I cannot get thru, did not receive message after speaking to husband yesterday. She was a pcp yesterday.

## 2019-07-25 ENCOUNTER — Encounter: Payer: Self-pay | Admitting: Diagnostic Neuroimaging

## 2019-07-25 ENCOUNTER — Ambulatory Visit (INDEPENDENT_AMBULATORY_CARE_PROVIDER_SITE_OTHER): Payer: BLUE CROSS/BLUE SHIELD | Admitting: Diagnostic Neuroimaging

## 2019-07-25 ENCOUNTER — Other Ambulatory Visit: Payer: Self-pay

## 2019-07-25 VITALS — BP 133/98 | HR 96 | Temp 97.3°F | Ht 63.0 in | Wt 214.0 lb

## 2019-07-25 DIAGNOSIS — G890 Central pain syndrome: Secondary | ICD-10-CM | POA: Diagnosis not present

## 2019-07-25 DIAGNOSIS — G43109 Migraine with aura, not intractable, without status migrainosus: Secondary | ICD-10-CM | POA: Diagnosis not present

## 2019-07-25 DIAGNOSIS — R519 Headache, unspecified: Secondary | ICD-10-CM

## 2019-07-25 MED ORDER — GABAPENTIN 300 MG PO CAPS: 300.0000 mg | ORAL_CAPSULE | Freq: Two times a day (BID) | ORAL | 12 refills | Status: DC

## 2019-07-25 MED ORDER — TOPIRAMATE 100 MG PO TABS: 100.0000 mg | ORAL_TABLET | Freq: Two times a day (BID) | ORAL | 12 refills | Status: DC

## 2019-07-25 MED ORDER — CARBAMAZEPINE 200 MG PO TABS: 400.0000 mg | ORAL_TABLET | Freq: Two times a day (BID) | ORAL | 12 refills | Status: DC

## 2019-07-25 NOTE — Patient Instructions (Signed)
  LEFT FACE PAIN (? idiopathic trigeminal neuralgia vs right thalamic lesion) - repeat MRI brain (w/wo) to re-evaluate right thalamic lesion - increase carbamazepine to 400mg  twice a day - continue gabapentin 300mg  twice a day   MIGRAINE WITH AURA - topiramate 100mg  twice a day - continue rizatriptan  LEFT NECK / SHOULDER PAIN / FIBROMYALGIA - conservative mgmt; cymbalta - follow up with PCP; consider pain mgmt  INSOMNIA / ANXIETY - sleep hygiene reviewed - consider psychology / psychiatry evaluation for anxiety disorder

## 2019-07-25 NOTE — Progress Notes (Signed)
[facility]  PATIENT: Debra Hensley DOB: Dec 06, 1980  REFERRING CLINICIAN: Cipriano Mile  HISTORY FROM: patient  REASON FOR VISIT: follow up   HISTORICAL  CHIEF COMPLAINT:  Chief Complaint  Patient presents with  . Migraine    rm 7 FU requested for worsening symptoms w/left facial pain, begins behind L ear and travels thru jaw/face up nose- into eye and to forehead,  PCP put her on Tramadol, phenergan; top of head feels like it's been scalped- comes and goes"    HISTORY OF PRESENT ILLNESS:   UPDATE (07/25/19, VRP): Since last visit, continues with intermittent left facial pain attacks. 2 attacks lasting 3-4 days each, with constant and intermittent pain. No alleviating or aggravating factors. Tolerating meds.    UPDATE (12/08/18, VRP):  - had recurrence of severe left facial and left eye pain; explosive feeling; left neck pain. - went to the ER yesterday, had CTA head / neck, unremarkable. - patient had stopped CBZ May 2020; then switched to lamotrigine in June 2020; then ran out of lamotrigine on 12/02/18; then HA and pain started on 12/04/18  UPDATE (06/13/18, VRP): Since last visit, doing poorly. More LEFT facial pain (not right side as previously indicated; patient corrects this fact for [place] on this visit). Symptoms last hours - days at a time. Has poor sleep. Symptoms are progressive. CBZ not helping. Feels tired all the time. No alleviating or aggravating factors.  Migraine are improved (1 per month now).   UPDATE (02/15/18, VRP): Since last visit, doing poorly. Symptoms are worsening and severe. Avg 1-2 migraine per month and rizatriptan helps. Right facial pain worsening, constant, severe. No alleviating or aggravating factors.    UPDATE (08/03/17, VRP): Since last visit, patient continues to have migraines approximately 1-2/month.  However patient has also developed a different kind of pain problem for past 3 years, worse in the last 6-12 months, with sudden sharp  pain in her right shoulder radiating to her right neck, right face and right eye.  Sometimes pain radiates down her right arm and has some numbness and tingling.  Sometimes has photophobia and phonophobia.  Pain is very severe and can last all day.  Patient also has some scalp sensitivity with this problem.    Patient continues to have significant sleep disturbance, averaging 3-4 hours of sleep per night.  This is related to insomnia and anxiety feelings as well as crowded bedroom where most nights 2 children and their family dog sleep in their bed.  UPDATE 08/02/15: Since last visit, now on TPX + sumatriptan. Still 1 HA every other week. Last week had migraine lasting 4 days. No triggers noted for HA. Int numbness still occuring.   PRIOR HPI 06/21/15: 39 year old right-handed female here for evaluation of numbness and tingling. Symptoms have been present for past 2-3 months. She describes migratory right greater than left side, arms, hands, legs and feet numbness and tingling sensation. Symptoms last minutes at a time can occur multiple times a day. She has these sensations approximately 10 days per month. Symptoms are particularly noticeable during migraine headache attacks. Patient has had migraine headaches since age 34 years old. She has a warning of scintillating visual phenomenon, followed by severe global headache with neck pain, shoulder pain, nausea, photophobia and phonophobia. When she was younger she would pass out from severe headaches. For most of her life she was having 1 migraine headache every few months. Over past few years this has increased to at least one migraine  attack per month, which can last at least 2 days. In the last few months she has been averaging 2-4 days of headache per month. She's never been on preventative medication. She has been on Imitrex 100 mg as needed for rescue medication. This seems to work well. She also takes [phone] mg of Tylenol every day for past few months.  Patient also has history of depression, anxiety and fibromyalgia. She is on Cymbalta, gabapentin and Vicodin as needed. No other specific triggering factors, change in stress, diet, exercise, sleep. She does have chronic fatigue in spite of adequate rest at nighttime. She had a sleep study a few years ago which apparently was negative for sleep apnea although she did not sleep well during the study.    REVIEW OF SYSTEMS: Full 14 system review of systems performed and negative except: as per HPI.  ALLERGIES: Allergies  Allergen Reactions  . Adhesive [Tape] Other (See Comments)    BLISTERS  . Hydrocodone Itching  . Codeine Itching  . Percocet [Oxycodone-Acetaminophen] Itching    HOME MEDICATIONS: Outpatient Medications Prior to Visit  Medication Sig Dispense Refill  . carbamazepine (TEGRETOL) 200 MG tablet Take 200 mg by mouth 2 (two) times daily.    . DULoxetine (CYMBALTA) 60 MG capsule Take 60 mg by mouth daily.  5  . gabapentin (NEURONTIN) 300 MG capsule Take 1 capsule (300 mg total) by mouth 3 (three) times daily. Start 300mg  twice daily for 1-2 weeks, then increase to 300mg  three times daily as needed. 90 capsule 11  . lamoTRIgine (LAMICTAL) 200 MG tablet Take 200 mg by mouth daily.    . promethazine (PHENERGAN) 25 MG tablet Take 25 mg by mouth every 6 (six) hours as needed.    . rizatriptan (MAXALT-MLT) 10 MG disintegrating tablet Take 1 tablet (10 mg total) by mouth as needed for migraine. May repeat in 2 hours if needed 9 tablet 11  . topiramate (TOPAMAX) 100 MG tablet Take 1 tablet (100 mg total) by mouth 2 (two) times daily. 60 tablet 12  . lamoTRIgine (LAMICTAL) 150 MG tablet Take 1 tablet (150 mg total) by mouth daily.    . Vitamin D, Ergocalciferol, (DRISDOL) 50000 units CAPS capsule Take 50,000 Units by mouth 2 (two) times a week.  0   No facility-administered medications prior to visit.    PAST MEDICAL HISTORY: Past Medical History:  Diagnosis Date  . Breast mass  05/2014   left  . Complication of anesthesia    states had a problem with her heart rate during UHR, and was hard to wake up post-op; could not be more specific, but was not referred to cardiologist  . Fibromyalgia   . Migraine   . Mood disorder ([place])   . Obesity   . Tachycardia    states heart rate has been high for last 3 doctor visits, has not been referred to cardiolgist or endocrinologist    PAST SURGICAL HISTORY: Past Surgical History:  Procedure Laterality Date  . BREAST EXCISIONAL BIOPSY Left    2015  . BREAST LUMPECTOMY WITH RADIOACTIVE SEED LOCALIZATION Left 05/15/2014   Procedure: LEFT BREAST LUMPECTOMY WITH RADIOACTIVE SEED LOCALIZATION;  Surgeon: Fanny Skates, MD;  Location: [facility];  Service: General;  Laterality: Left;  . CHOLECYSTECTOMY  08/01/2010  . DILATION AND CURETTAGE OF UTERUS  01/06/2002  . ELBOW FRACTURE SURGERY Right    age 60  . NASAL TURBINATE REDUCTION    . TONSILLECTOMY  1989  . UMBILICAL HERNIA REPAIR  [id number]  . UMBILICAL HERNIA REPAIR  2010    FAMILY HISTORY: Family History  Problem Relation Age of Onset  . Ovarian cancer Mother   . Migraines Mother   . Hypertension Mother   . Migraines Son     SOCIAL HISTORY:  Social History   Socioeconomic History  . Marital status: Married    Spouse name: Legrand Como  . Number of children: 3  . Years of education: 81  . Highest education level: Not on file  Occupational History  . Occupation: Stay at home  Tobacco Use  . Smoking status: Never Smoker  . Smokeless tobacco: Never Used  Substance and Sexual Activity  . Alcohol use: Yes    Comment: rarely  . Drug use: No  . Sexual activity: Yes    Birth control/protection: I.U.D.  Other Topics Concern  . Not on file  Social History Narrative   Lives at home with husband and 3 kids   Education -    Caffeine use: 1 cup daily   Social Determinants of Health   Financial Resource Strain:   . Difficulty of Paying Living Expenses:     Food Insecurity:   . Worried About Charity fundraiser in the Last Year:   . Arboriculturist in the Last Year:   Transportation Needs:   . Film/video editor (Medical):   Marland Kitchen Lack of Transportation (Non-Medical):   Physical Activity:   . Days of Exercise per Week:   . Minutes of Exercise per Session:   Stress:   . Feeling of Stress :   Social Connections:   . Frequency of Communication with Friends and Family:   . Frequency of Social Gatherings with Friends and Family:   . Attends Religious Services:   . Active Member of Clubs or Organizations:   . Attends Archivist Meetings:   Marland Kitchen Marital Status:   Intimate Partner Violence:   . Fear of Current or Ex-Partner:   . Emotionally Abused:   Marland Kitchen Physically Abused:   . Sexually Abused:     PHYSICAL EXAM  GENERAL EXAM/CONSTITUTIONAL: Vitals:  Vitals:   07/25/19 0818  BP: (!) 133/98  Pulse: 96  Temp: (!) 97.3 F (36.3 C)  Weight: 214 lb (97.1 kg)  Height: 5\' 3"  (1.6 m)   Wt Readings from Last 3 Encounters:  07/25/19 214 lb (97.1 kg)  01/25/19 209 lb 9.6 oz (95.1 kg)  06/13/18 212 lb (96.2 kg)    Body mass index is 37.91 kg/m. No exam data present  Patient is in NOT IN ACUTE DISTRESS; well developed, nourished and groomed; neck is supple  CARDIOVASCULAR:  Examination of carotid arteries is normal; no carotid bruits  Regular rate and rhythm, no murmurs  Examination of peripheral vascular system by observation and palpation is normal  EYES:  Ophthalmoscopic exam of optic discs and posterior segments is normal; no papilledema or hemorrhages  MUSCULOSKELETAL:  Gait, strength, tone, movements noted in Neurologic exam below  NEUROLOGIC: MENTAL STATUS:  No flowsheet data found.  awake, alert, oriented to person, place and time  recent and remote memory intact  normal attention and concentration  language fluent, comprehension intact, naming intact,   fund of knowledge appropriate  CRANIAL  NERVE:   2nd - no papilledema on fundoscopic exam  2nd, 3rd, 4th, 6th - pupils equal and reactive to light, visual fields full to confrontation, extraocular muscles intact, no nystagmus  5th - facial sensation SYMM  7th - facial strength  symmetric  8th - hearing intact  9th - palate elevates symmetrically, uvula midline  11th - shoulder shrug symmetric  12th - tongue protrusion midline  MOTOR:   normal bulk and tone, full strength in the BUE, BLE  SENSORY:   normal and symmetric to light touch  COORDINATION:   finger-nose-finger, fine finger movements normal  REFLEXES:   deep tendon reflexes TRACE and symmetric  GAIT/STATION:   narrow based gait    DIAGNOSTIC DATA (LABS, IMAGING, TESTING) - I reviewed patient records, labs, notes, testing and imaging myself where available.  Lab Results  Component Value Date   WBC 10.4 12/07/2018   HGB 14.8 12/07/2018   HCT 44.7 12/07/2018   MCV 93.1 12/07/2018   PLT 293 12/07/2018      Component Value Date/Time   NA 137 12/07/2018 1621   K 4.5 12/07/2018 1621   CL 106 12/07/2018 1621   CO2 21 (L) 12/07/2018 1621   GLUCOSE 96 12/07/2018 1621   BUN 16 12/07/2018 1621   CREATININE 1.10 (H) 12/07/2018 1625   CALCIUM 8.9 12/07/2018 1621   PROT 7.5 02/28/2018 1815   ALBUMIN 4.2 02/28/2018 1815   AST 19 02/28/2018 1815   ALT 28 02/28/2018 1815   ALKPHOS 73 02/28/2018 1815   BILITOT 0.4 02/28/2018 1815   GFRNONAA >60 12/07/2018 1621   GFRAA >60 12/07/2018 1621   No results found for: CHOL, HDL, LDLCALC, LDLDIRECT, TRIG, CHOLHDL Lab Results  Component Value Date   HGBA1C 5.5 07/05/2018   Lab Results  Component Value Date   [id number] 07/05/2018   Lab Results  Component Value Date   TSH 0.612 07/05/2018    04/08/12 xray lumbar [I reviewed images myself and agree with interpretation. -VRP]  - negative  02/13/18 MRI brain and cervical [I reviewed images myself and agree with interpretation. Except small  right thalamic non-specific gliosis.  -VRP]  - unremarkable  07/05/18 MRI brain  - Stable small (48mm) focus of right thalamic gliosis. No change from MRI on 02/13/18.     ASSESSMENT AND PLAN  39 y.o. year old female here with 2-3 months migratory numbness, sometimes assoc with migraine but sometimes independent. Also with worsening LEFT facial pain, eye pain, shoulder and arm pain / numbness (? Related to right thalamic lesion).    Dx: migraine phenomenon + idiopathic trigeminal neuralgia + fibromyalgia  1. Thalamic pain syndrome   2. Migraine with aura and without status migrainosus, not intractable   3. Left facial pain      PLAN:  LEFT FACE PAIN (? idiopathic trigeminal neuralgia vs right thalamic lesion) - repeat MRI brain (w/wo) to re-evaluate right thalamic lesion - increase carbamazepine to 400mg  twice a day - continue gabapentin 300mg  twice a day; may increase in future  MIGRAINE WITH AURA - topiramate 100mg  twice a day - continue rizatriptan  LEFT NECK / SHOULDER PAIN / FIBROMYALGIA - conservative mgmt; cymbalta and gabapentin - follow up with PCP; consider pain mgmt  INSOMNIA / ANXIETY - sleep hygiene reviewed - consider psychology / psychiatry evaluation for anxiety disorder  Meds ordered this encounter  Medications  . carbamazepine (TEGRETOL) 200 MG tablet    Sig: Take 2 tablets (400 mg total) by mouth 2 (two) times daily.    Dispense:  120 tablet    Refill:  12  . gabapentin (NEURONTIN) 300 MG capsule    Sig: Take 1 capsule (300 mg total) by mouth 2 (two) times daily.    Dispense:  60  capsule    Refill:  12  . topiramate (TOPAMAX) 100 MG tablet    Sig: Take 1 tablet (100 mg total) by mouth 2 (two) times daily.    Dispense:  60 tablet    Refill:  12   Orders Placed This Encounter  Procedures  . MR BRAIN W WO CONTRAST   Return in about 6 months (around 01/25/2020).    Penni Bombard, MD [id number], [id number] AM Certified in Neurology,  Neurophysiology and Neuroimaging  [facility] [address] [place], [place] [address] [phone]

## 2019-07-28 ENCOUNTER — Ambulatory Visit: Payer: BLUE CROSS/BLUE SHIELD | Attending: Internal Medicine

## 2019-07-31 ENCOUNTER — Telehealth: Payer: Self-pay | Admitting: Diagnostic Neuroimaging

## 2019-07-31 NOTE — Telephone Encounter (Signed)
[employer] Auth: [id number] (exp. 07/31/19 to 01/26/20) order sent to GI. They will reach out to the patient to schedule.

## 2019-08-11 DIAGNOSIS — E559 Vitamin D deficiency, unspecified: Secondary | ICD-10-CM | POA: Diagnosis not present

## 2019-08-11 DIAGNOSIS — R202 Paresthesia of skin: Secondary | ICD-10-CM | POA: Diagnosis not present

## 2019-08-11 DIAGNOSIS — F39 Unspecified mood [affective] disorder: Secondary | ICD-10-CM | POA: Diagnosis not present

## 2019-08-11 DIAGNOSIS — G4701 Insomnia due to medical condition: Secondary | ICD-10-CM | POA: Diagnosis not present

## 2019-08-11 DIAGNOSIS — M797 Fibromyalgia: Secondary | ICD-10-CM | POA: Diagnosis not present

## 2019-08-25 DIAGNOSIS — F419 Anxiety disorder, unspecified: Secondary | ICD-10-CM | POA: Diagnosis not present

## 2019-08-25 DIAGNOSIS — F39 Unspecified mood [affective] disorder: Secondary | ICD-10-CM | POA: Diagnosis not present

## 2019-08-25 DIAGNOSIS — F329 Major depressive disorder, single episode, unspecified: Secondary | ICD-10-CM | POA: Diagnosis not present

## 2019-08-25 DIAGNOSIS — M797 Fibromyalgia: Secondary | ICD-10-CM | POA: Diagnosis not present

## 2019-09-02 ENCOUNTER — Other Ambulatory Visit: Payer: Self-pay

## 2019-09-02 ENCOUNTER — Ambulatory Visit
Admission: RE | Admit: 2019-09-02 | Discharge: 2019-09-02 | Disposition: A | Discharge status: Home / Self Care | Payer: BLUE CROSS/BLUE SHIELD | Source: Ambulatory Visit | Attending: Diagnostic Neuroimaging | Admitting: Diagnostic Neuroimaging

## 2019-09-02 DIAGNOSIS — R519 Headache, unspecified: Secondary | ICD-10-CM | POA: Diagnosis not present

## 2019-09-02 DIAGNOSIS — G890 Central pain syndrome: Secondary | ICD-10-CM

## 2019-09-02 MED ORDER — GADOBENATE DIMEGLUMINE 529 MG/ML IV SOLN
20.0000 mL | Freq: Once | INTRAVENOUS | Status: AC | PRN
  Administered 2019-09-02: 17:00:00 20 mL via INTRAVENOUS

## 2019-10-05 DIAGNOSIS — F419 Anxiety disorder, unspecified: Secondary | ICD-10-CM | POA: Diagnosis not present

## 2019-10-05 DIAGNOSIS — F329 Major depressive disorder, single episode, unspecified: Secondary | ICD-10-CM | POA: Diagnosis not present

## 2019-10-05 DIAGNOSIS — F39 Unspecified mood [affective] disorder: Secondary | ICD-10-CM | POA: Diagnosis not present

## 2019-10-05 DIAGNOSIS — M797 Fibromyalgia: Secondary | ICD-10-CM | POA: Diagnosis not present

## 2019-10-10 ENCOUNTER — Telehealth: Payer: Self-pay | Admitting: *Deleted

## 2019-10-10 NOTE — Telephone Encounter (Signed)
Unable to LVM on mobile #. Called home #, and a female answered, stated he was her husband. No DPR on file; he stated he would have her return call.

## 2019-10-12 ENCOUNTER — Encounter: Payer: Self-pay | Admitting: *Deleted

## 2019-11-09 DIAGNOSIS — M5481 Occipital neuralgia: Secondary | ICD-10-CM | POA: Diagnosis not present

## 2019-11-09 DIAGNOSIS — G43009 Migraine without aura, not intractable, without status migrainosus: Secondary | ICD-10-CM | POA: Diagnosis not present

## 2019-11-09 DIAGNOSIS — G500 Trigeminal neuralgia: Secondary | ICD-10-CM | POA: Diagnosis not present

## 2019-11-09 DIAGNOSIS — R202 Paresthesia of skin: Secondary | ICD-10-CM | POA: Diagnosis not present

## 2019-11-09 DIAGNOSIS — R519 Headache, unspecified: Secondary | ICD-10-CM | POA: Diagnosis not present

## 2019-12-01 DIAGNOSIS — Z03818 Encounter for observation for suspected exposure to other biological agents ruled out: Secondary | ICD-10-CM | POA: Diagnosis not present

## 2019-12-01 DIAGNOSIS — J029 Acute pharyngitis, unspecified: Secondary | ICD-10-CM | POA: Diagnosis not present

## 2019-12-01 DIAGNOSIS — E559 Vitamin D deficiency, unspecified: Secondary | ICD-10-CM | POA: Diagnosis not present

## 2019-12-12 ENCOUNTER — Other Ambulatory Visit: Payer: Self-pay | Admitting: Family Medicine

## 2019-12-15 DIAGNOSIS — N6310 Unspecified lump in the right breast, unspecified quadrant: Secondary | ICD-10-CM | POA: Diagnosis not present

## 2019-12-15 DIAGNOSIS — M549 Dorsalgia, unspecified: Secondary | ICD-10-CM | POA: Diagnosis not present

## 2019-12-19 ENCOUNTER — Other Ambulatory Visit: Payer: Self-pay | Admitting: Physician Assistant

## 2019-12-19 DIAGNOSIS — N6310 Unspecified lump in the right breast, unspecified quadrant: Secondary | ICD-10-CM

## 2019-12-21 ENCOUNTER — Other Ambulatory Visit: Payer: Self-pay | Admitting: Physician Assistant

## 2019-12-21 DIAGNOSIS — M549 Dorsalgia, unspecified: Secondary | ICD-10-CM

## 2020-01-05 ENCOUNTER — Ambulatory Visit
Admission: RE | Admit: 2020-01-05 | Discharge: 2020-01-05 | Disposition: A | Discharge status: Home / Self Care | Payer: BLUE CROSS/BLUE SHIELD | Source: Ambulatory Visit | Attending: Physician Assistant | Admitting: Physician Assistant

## 2020-01-05 ENCOUNTER — Other Ambulatory Visit: Payer: Self-pay

## 2020-01-05 DIAGNOSIS — N6310 Unspecified lump in the right breast, unspecified quadrant: Secondary | ICD-10-CM

## 2020-01-05 DIAGNOSIS — N6489 Other specified disorders of breast: Secondary | ICD-10-CM | POA: Diagnosis not present

## 2020-01-05 DIAGNOSIS — R928 Other abnormal and inconclusive findings on diagnostic imaging of breast: Secondary | ICD-10-CM | POA: Diagnosis not present

## 2020-01-14 DIAGNOSIS — R05 Cough: Secondary | ICD-10-CM | POA: Diagnosis not present

## 2020-01-14 DIAGNOSIS — Z03818 Encounter for observation for suspected exposure to other biological agents ruled out: Secondary | ICD-10-CM | POA: Diagnosis not present

## 2020-01-23 DIAGNOSIS — G43009 Migraine without aura, not intractable, without status migrainosus: Secondary | ICD-10-CM | POA: Diagnosis not present

## 2020-01-23 DIAGNOSIS — E559 Vitamin D deficiency, unspecified: Secondary | ICD-10-CM | POA: Diagnosis not present

## 2020-01-23 DIAGNOSIS — G500 Trigeminal neuralgia: Secondary | ICD-10-CM | POA: Diagnosis not present

## 2020-01-28 ENCOUNTER — Ambulatory Visit
Admission: RE | Admit: 2020-01-28 | Discharge: 2020-01-28 | Disposition: A | Discharge status: Home / Self Care | Payer: BLUE CROSS/BLUE SHIELD | Source: Ambulatory Visit | Attending: Physician Assistant | Admitting: Physician Assistant

## 2020-01-28 DIAGNOSIS — M549 Dorsalgia, unspecified: Secondary | ICD-10-CM

## 2020-01-28 DIAGNOSIS — M545 Low back pain: Secondary | ICD-10-CM | POA: Diagnosis not present

## 2020-01-28 MED ORDER — GADOBENATE DIMEGLUMINE 529 MG/ML IV SOLN
20.0000 mL | Freq: Once | INTRAVENOUS | Status: AC | PRN
  Administered 2020-01-28: 20 mL via INTRAVENOUS

## 2020-01-30 ENCOUNTER — Ambulatory Visit: Payer: BLUE CROSS/BLUE SHIELD | Admitting: Diagnostic Neuroimaging

## 2020-01-30 ENCOUNTER — Encounter: Payer: Self-pay | Admitting: Diagnostic Neuroimaging

## 2020-01-30 ENCOUNTER — Telehealth: Payer: Self-pay | Admitting: *Deleted

## 2020-01-30 NOTE — Telephone Encounter (Signed)
Patient was no show for follow up today. 

## 2020-02-01 DIAGNOSIS — E559 Vitamin D deficiency, unspecified: Secondary | ICD-10-CM | POA: Diagnosis not present

## 2020-05-16 DIAGNOSIS — R6889 Other general symptoms and signs: Secondary | ICD-10-CM | POA: Diagnosis not present

## 2020-05-16 DIAGNOSIS — R110 Nausea: Secondary | ICD-10-CM | POA: Diagnosis not present

## 2020-05-16 DIAGNOSIS — F329 Major depressive disorder, single episode, unspecified: Secondary | ICD-10-CM | POA: Diagnosis not present

## 2020-05-17 DIAGNOSIS — R6889 Other general symptoms and signs: Secondary | ICD-10-CM | POA: Diagnosis not present

## 2020-06-08 IMAGING — MG DIGITAL DIAGNOSTIC BILATERAL MAMMOGRAM WITH TOMO AND CAD
6 of 10 series · 6 of 30 positions shown · non-contrast
Comparison: Previous exam(s).

CLINICAL DATA: Patient complains of a palpable abnormality in the
upper outer quadrant of the right breast. The patient states that
she had eczema of the right nipple and saw a yellow discharge in her
bra. She thinks the nipple discharge may be related to the eczema.

EXAM:
DIGITAL DIAGNOSTIC BILATERAL MAMMOGRAM WITH CAD AND TOMO
ULTRASOUND RIGHT BREAST

[L CC synth-2D]
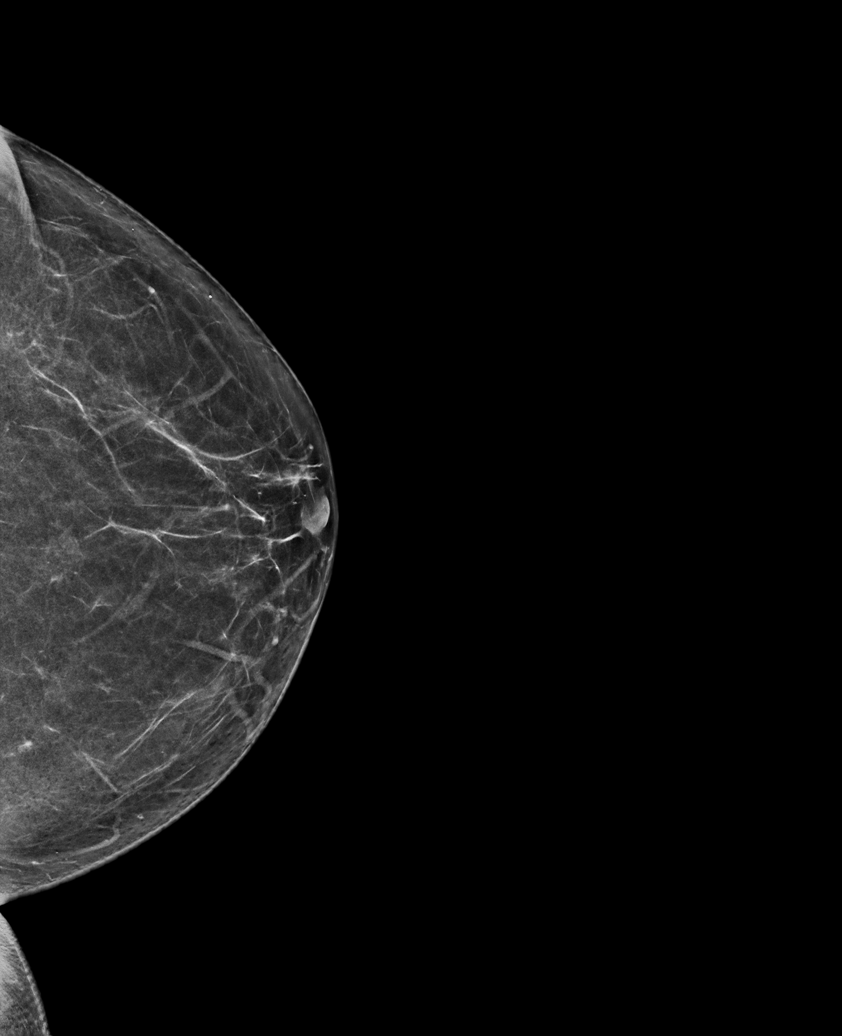

[L MLO synth-2D]
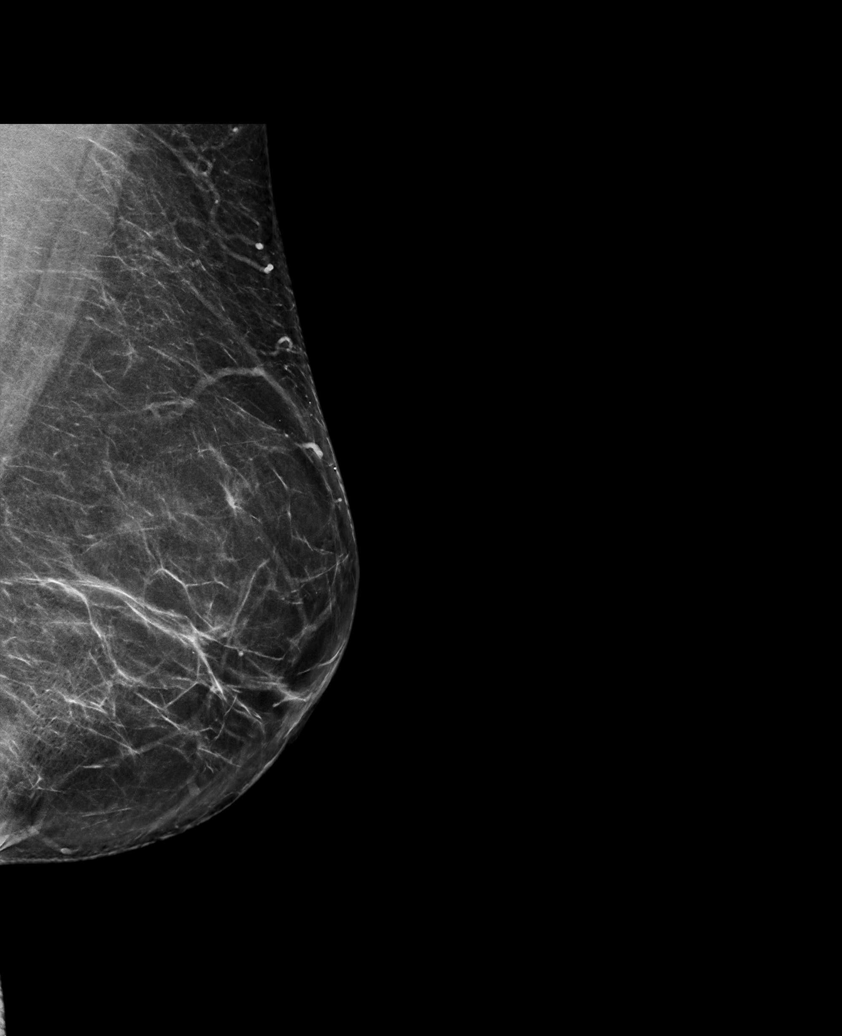

[R CC synth-2D (1 of 2)]
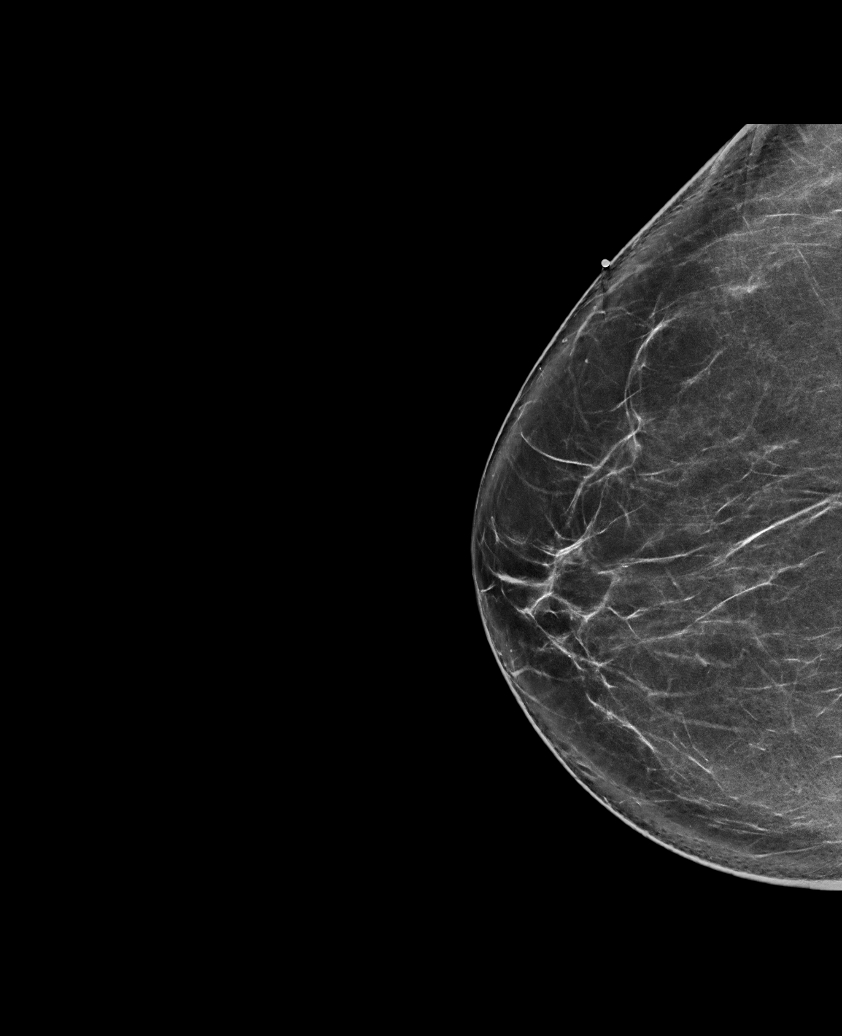

[R CC synth-2D (2 of 2)]
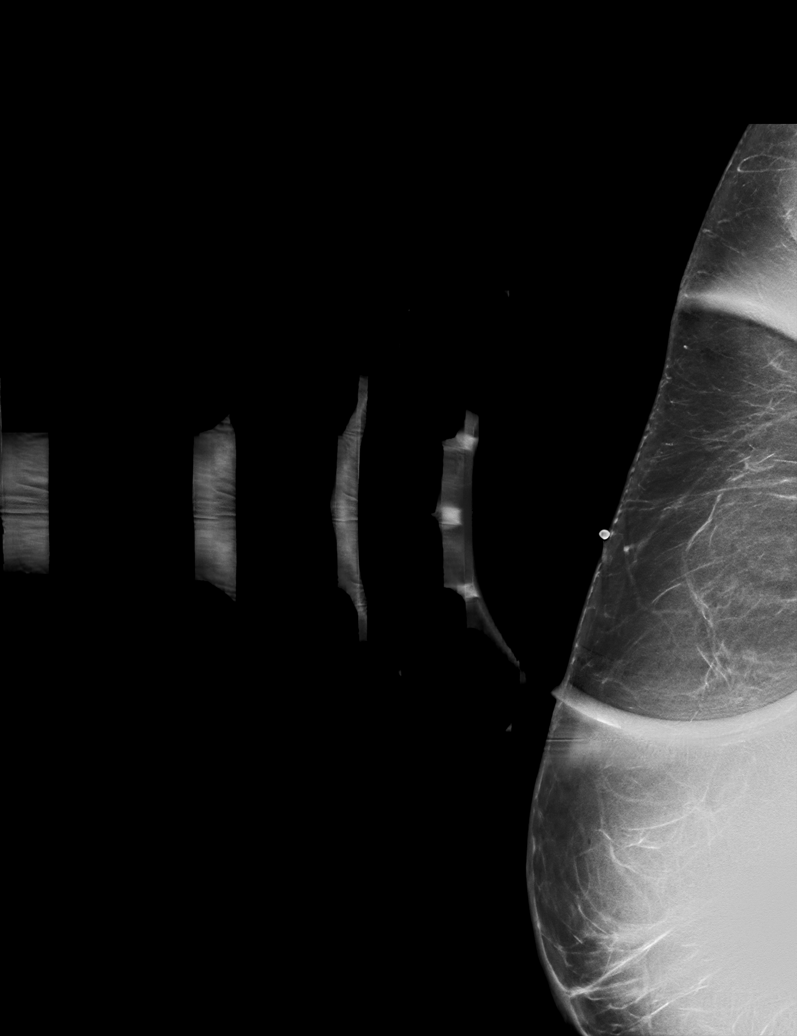

[R MLO synth-2D]
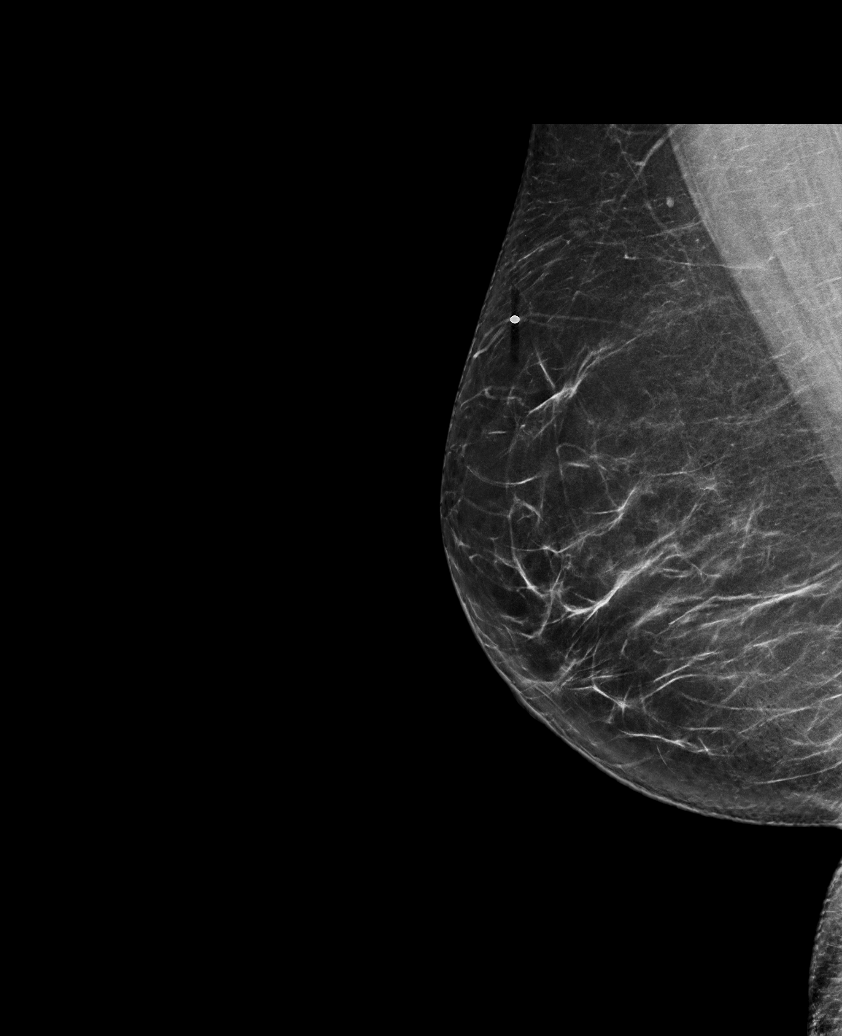

[R MLO tomo · tomo slice 42/83.0]
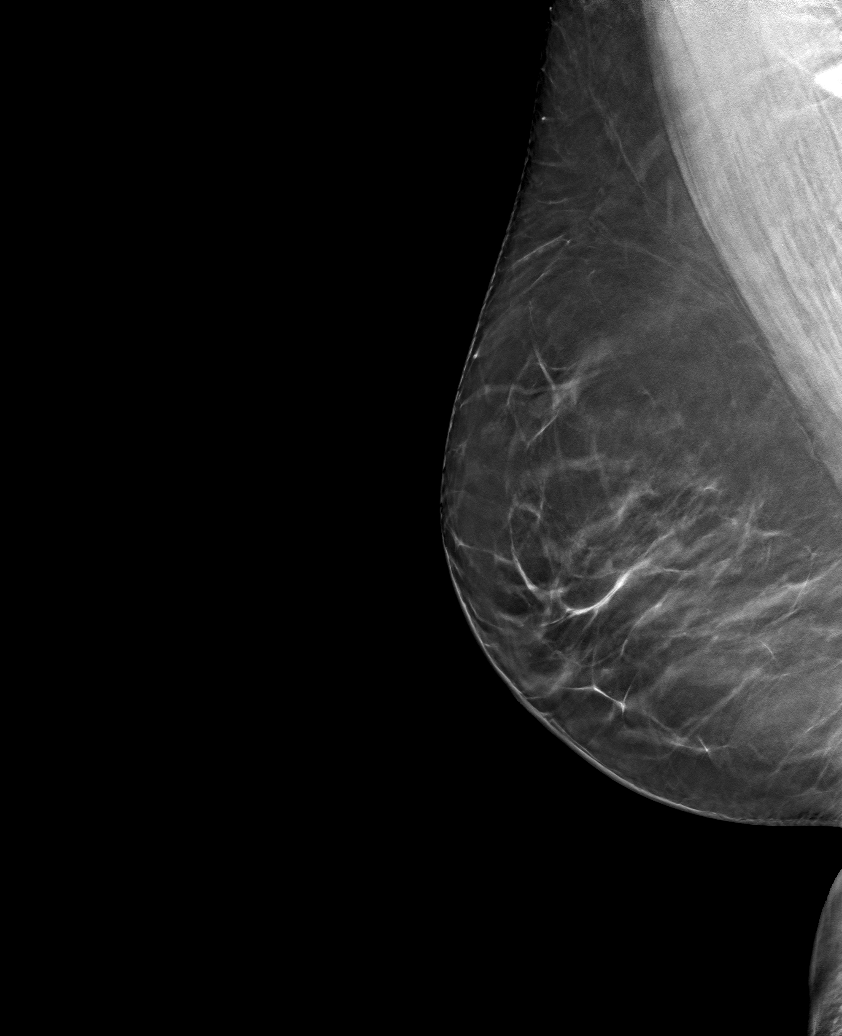

[6 of 30 positions shown; findings below may reference images not displayed]

ACR Breast Density Category b: There are scattered areas of
fibroglandular density.
FINDINGS: No suspicious mass, malignant type microcalcifications or distortion
detected in either breast. Spot tangential view of the area of
clinical concern in the upper-outer quadrant of the right breast
shows normal fibrofatty tissue.

Mammographic images were processed with CAD.

On physical exam, I do not palpate a mass in the upper-outer
quadrant of the right breast. The right nipple appears normal with
no erythema or ulceration. No nipple discharge could be expressed.

Targeted ultrasound is performed, showing normal tissue in the
upper-outer quadrant of the right breast. Normal tissue of the
subareolar region of the right breast. No dilated ducts or
intraductal mass identified.
IMPRESSION: No evidence of malignancy in either breast.

RECOMMENDATION:
Bilateral screening mammogram in 1 year is recommended.

Concerning nipple discharge was discussed with the patient. If the
patient experiences concerning nipple discharge further evaluation
may be indicated.

I have discussed the findings and recommendations with the patient.
Results were also provided in writing at the conclusion of the
visit. If applicable, a reminder letter will be sent to the patient
regarding the next appointment.

BI-RADS CATEGORY  1: Negative.

## 2020-08-30 DIAGNOSIS — M797 Fibromyalgia: Secondary | ICD-10-CM | POA: Diagnosis not present

## 2020-08-30 DIAGNOSIS — R202 Paresthesia of skin: Secondary | ICD-10-CM | POA: Diagnosis not present

## 2020-08-30 DIAGNOSIS — G43109 Migraine with aura, not intractable, without status migrainosus: Secondary | ICD-10-CM | POA: Diagnosis not present

## 2020-08-30 DIAGNOSIS — G500 Trigeminal neuralgia: Secondary | ICD-10-CM | POA: Diagnosis not present

## 2020-09-09 ENCOUNTER — Telehealth: Payer: Self-pay | Admitting: Diagnostic Neuroimaging

## 2020-09-09 NOTE — Telephone Encounter (Signed)
Hello, this patient is requesting to switch from Dr. Leta Baptist to Dr. Jaynee Eagles. She is being referred for trigeminal neuralgia, migraines, and paresthesias. Please advise if this is acceptable, thank you!

## 2020-09-09 NOTE — Telephone Encounter (Signed)
She has already seen 2 providers at [facility] neurology, I reviewed chart and I don;t have any more to offer than the other two providers she has already seen here at [facility]. Thanks.

## 2020-09-23 DIAGNOSIS — L308 Other specified dermatitis: Secondary | ICD-10-CM | POA: Diagnosis not present

## 2020-09-23 DIAGNOSIS — B360 Pityriasis versicolor: Secondary | ICD-10-CM | POA: Diagnosis not present

## 2020-10-03 DIAGNOSIS — J019 Acute sinusitis, unspecified: Secondary | ICD-10-CM | POA: Diagnosis not present

## 2021-08-07 ENCOUNTER — Other Ambulatory Visit: Payer: Self-pay | Admitting: Internal Medicine

## 2021-08-07 DIAGNOSIS — R9082 White matter disease, unspecified: Secondary | ICD-10-CM

## 2021-09-08 ENCOUNTER — Ambulatory Visit
Admission: RE | Admit: 2021-09-08 | Discharge: 2021-09-08 | Disposition: A | Discharge status: Home / Self Care | Payer: 59 | Source: Ambulatory Visit | Attending: Internal Medicine | Admitting: Internal Medicine

## 2021-09-08 DIAGNOSIS — R9082 White matter disease, unspecified: Secondary | ICD-10-CM

## 2021-09-08 MED ORDER — GADOBENATE DIMEGLUMINE 529 MG/ML IV SOLN
18.0000 mL | Freq: Once | INTRAVENOUS | Status: AC | PRN
  Administered 2021-09-08: 18 mL via INTRAVENOUS

## 2022-07-10 ENCOUNTER — Encounter: Payer: Self-pay | Admitting: Neurology

## 2022-07-22 NOTE — Progress Notes (Addendum)
Initial neurology clinic note  Debra Hensley MRN: [id number] DOB: 04/17/1981  Referring provider: Carol Ada, MD  Primary care provider: Carol Ada, MD  Reason for consult:  headaches and facial pain  Subjective:  This is Debra Hensley, a 42 y.o. right-handed female with a medical history of migraine with aura, fibromyalgia, anxiety, depression who presents to neurology clinic with headaches and facial pain. The patient is alone today.  Patient has a long history of migraines since childhood and a strong family history of migraines. About 5 years ago, (?2017) she started having mostly left face pain (very rare on right). It feels like she has pain from left ear to jaw to nose then eye. It can be triggered by crying, brushing her teeth, chewing the wrong way, or stress. She describes a sharp stabbing pain that becomes constant for up to 3 days (does not quickly come and go). She has phonophobia, but not clear photophobia. She denies nausea or vomiting. It will hurt for her to talk. She has to stay in bed.  Rizatriptan can help. It can make symptoms go away in about 30 minutes, but symptoms return within a few hours. She will take 9-10 rizatriptans in a 3 day period when her symptoms get bad. She will also take tylenol, 600 mg every 4-6 hours during her 3 day headaches.  For prevention, she is currently on Cymbalta 180 mg qhs (prescribed 120 mg) and trileptal 450 mg qhs (prescribed 150 mg in am and 450 mg qhs). She has tried many previous medications, see below. She has not previously tried botox, CGRP inhibitors, or gepants.   She will get a 3 day pain episode once per week or once every other week (2-4 episodes per month).  Patient has previously been seen by multiple neurologists. In 2017, Dr. Leta Baptist at [facility] mentioned migraines with aura since the age of 18. Notes first mention left face pain associated with pain in eye, neck, and shoulder as well in 2019. MRI on 02/13/18  showed small right thalamic gliosis. Patient was diagnosed with thalamic pain syndrome.  Patient was then seen virtually at [facility] in 2021. Their impression was that patient had an atypical form of trigeminal neuralgia as there was jabbing and stabbing pain in left V1-V3 distribution that constantly repeats and can last 3-4 days.  Patient was seen at [facility] in 2022 for essential tremor. Per 10/23/2021 note from Dr. Lucile Crater: "Exam had findings of mild finger myoclonus and I suspect her jerking during sleep is nocturnal myoclonus. She also had a tremor of variable frequency with distractibility and entrainment suggestive of a functional component. We discontinued clonazepam as she had daytime sleepiness with no benefit to myoclonus. Since her last visit, the patient initiated Keppra 250 mg bid without improvement in myoclonus and with increased eczema flare-ups. For various reasons she self-discontinued Keppra and all other medications - with the exceptions of Trileptal and Cymbalta - with subsequent worsening of her trigeminal neuralgia which, when poorly controlled, is significantly more bothersome than her abnormal movements. She was seen by Dr. Ulice Dash for her headaches around 2 weeks ago who recommended successive titration of Trileptal, verapamil, and tizanidine. Given that she is in the midst of adjusting several medications for headaches, we will defer trialing any medications for myoclonus at this time. We discussed Depakote for future consideration. I have counseled on CBT-based therapy for the functional component."  Patient was last seen by Dr. Lynnette Caffey at [facility] on 04/14/22. Per that clinic  note: Patient states that about 3 and half years ago, she started having episodes of right-sided facial pain that would originate behind the right ear and radiated up into the right orbit and cheek area. Pain would be so severe that she described it as a hot poker and sharp in nature. Typically these pain sensations would  last about 3 days before they would resolve. The only thing that would alleviate the pain even remotely would be taking multiple doses of rizatriptan (more than prescribed). Triggers would include stress, being tired and brushing her teeth. Has undergone imaging studies that did not show any acute abnormalities that would be contributing to the pain. Initially was seen neurology who thought this was more of a migraine variant, but then later she was seen at [facility] where she was told this was more related to trigeminal neuralgia. Has not noted any clear of facial flushing, tearing or rhinitis when these occur. Sometimes feels like she will be drooling when the pain is at its worst. Also has been noticing other issues where she will have some tremors at times, losing her balance issues with walking and getting her words out. Dors is some tingling of both feet and hands, but more than anything, the pain in the face seems to be the most prominent issue for her today. Has tried numerous medications in the past and stated that a lot of these were discontinued on her own secondary to side effects can try and remain functional. At present, taking oxcarbazepine 450 mg at night, but was not able to tolerate this as much during the day secondary to sedation.  Previous medications tried Trileptal Cymbalta Gabapentin Lyrica Indomethacin Rizatriptan Sumatriptan Keppra Verapamil Sumatriptan hand Naproxen Clonazepam Carbamazepine Lamictal Topamax   Assessment  42 y.o. female with PMHx as above who presents for evaluation of trigeminal neuralgia. Patient's neurologic exam is nonfocal and nonlateralizing, which is reassuring. At this point, it is unclear how much of this is actually related to her trigeminal neuralgia versus an atypical headache disorder, such as hemicrania continua. Generally with trigeminal neuralgia at the symptoms are more persistent. And, with a headache disorders, they still can involve the  trigeminal nerve. She is already tried multiple medications and had several different side effects. At this point, I do not think there is anything else additional that I would be able to offer that has not already been tried. May benefit from seeing a dedicated headache provider or pain specialist. Injections might be another option.  Plan   Trigeminal neuralgia versus atypical headache disorder Already tried and failed numerous different medications May continue with the Cymbalta and Trileptal Referral to headache specialist Could also consider referral to pain specialist   Patient has a family, does not wish to have more children, and currently has an IUD in place.  She does not drink caffeine. She drinks EtOH only rarely. She does not smoke.  Patient also mentions various other symptoms: -She shakes all over all the time. -She has difficulty getting her words out. No clear pattern. -Memory difficulties -Scalp hurts to touch -Neck pain -Difficulty controlling bladder (no difficulty with bowels) -Tingling in arms and legs, no pattern or triggers, but comes and goes frequently - wonders if it is medication related   MEDICATIONS:  Outpatient Encounter Medications as of 07/29/2022  Medication Sig   acetaminophen (TYLENOL) 500 MG tablet Take 500 mg by mouth every 6 (six) hours as needed.   cholecalciferol (VITAMIN D3) 25 MCG (1000 UNIT) tablet Take  1,000 Units by mouth daily.   DULoxetine (CYMBALTA) 60 MG capsule Take 120 mg by mouth daily.   Erenumab-aooe (AIMOVIG) 140 MG/ML SOAJ Inject 140 mg into the skin every 28 (twenty-eight) days.   OXcarbazepine (TRILEPTAL) 150 MG tablet Take 150 mg by mouth 4 (four) times daily. 1 in am and 3 at night   Rimegepant Sulfate (NURTEC) 75 MG TBDP Take 1 tablet (75 mg total) by mouth as needed.   rizatriptan (MAXALT-MLT) 10 MG disintegrating tablet Take 1 tablet (10 mg total) by mouth as needed for migraine. May repeat in 2 hours if needed    [DISCONTINUED] carbamazepine (TEGRETOL) 200 MG tablet Take 2 tablets (400 mg total) by mouth 2 (two) times daily.   [DISCONTINUED] gabapentin (NEURONTIN) 300 MG capsule Take 1 capsule (300 mg total) by mouth 2 (two) times daily.   [DISCONTINUED] lamoTRIgine (LAMICTAL) 200 MG tablet Take 200 mg by mouth daily.   [DISCONTINUED] promethazine (PHENERGAN) 25 MG tablet Take 25 mg by mouth every 6 (six) hours as needed.   [DISCONTINUED] topiramate (TOPAMAX) 100 MG tablet Take 1 tablet (100 mg total) by mouth 2 (two) times daily.   No facility-administered encounter medications on file as of 07/29/2022.    PAST MEDICAL HISTORY: Past Medical History:  Diagnosis Date   Breast mass [id number]   left   Complication of anesthesia    states had a problem with her heart rate during UHR, and was hard to wake up post-op; could not be more specific, but was not referred to cardiologist   Fibromyalgia    Migraine    Mood disorder ([place])    Obesity    Tachycardia    states heart rate has been high for last 3 doctor visits, has not been referred to cardiolgist or endocrinologist    PAST SURGICAL HISTORY: Past Surgical History:  Procedure Laterality Date   BREAST EXCISIONAL BIOPSY Left    2015   BREAST LUMPECTOMY WITH RADIOACTIVE SEED LOCALIZATION Left 05/15/2014   Procedure: LEFT BREAST LUMPECTOMY WITH RADIOACTIVE SEED LOCALIZATION;  Surgeon: Fanny Skates, MD;  Location: [place];  Service: General;  Laterality: Left;   CHOLECYSTECTOMY  08/01/2010   DILATION AND CURETTAGE OF UTERUS  01/06/2002   ELBOW FRACTURE SURGERY Right    age 30   NASAL TURBINATE REDUCTION     TONSILLECTOMY  [id number]   UMBILICAL HERNIA REPAIR  [id number]   UMBILICAL HERNIA REPAIR  2010    ALLERGIES: Allergies  Allergen Reactions   Adhesive [Tape] Other (See Comments)    BLISTERS   Hydrocodone Itching   Codeine Itching   Percocet [Oxycodone-Acetaminophen] Itching    FAMILY HISTORY: Family History  Problem Relation  Age of Onset   Migraines Mother    Hypertension Mother    Hypertension Father    Hyperthyroidism Sister    Polycystic ovary syndrome Sister    Heart attack Maternal Grandfather    Heart attack Paternal Grandmother    Heart disease Paternal Grandfather    Migraines Son     SOCIAL HISTORY: Social History   Tobacco Use   Smoking status: Never   Smokeless tobacco: Never  Vaping Use   Vaping Use: Never used  Substance Use Topics   Alcohol use: Yes    Comment: rarely   Drug use: No   Social History   Social History Narrative   Lives at home with husband and 3 kids   Education -    Caffeine use: 1 cup daily   Are  you right handed or left handed? right   Are you currently employed ? yes   What is your current occupation?   Do you live at home alone? no   Who lives with you? Husband and children   What type of home do you live in: 1 story or 2 story? one        Objective:  Vital Signs:  BP 132/88   Pulse (!) 106   Ht 5\' 3"  (1.6 m)   Wt 231 lb (104.8 kg)   SpO2 96%   BMI 40.92 kg/m   General: No acute distress.  Patient appears well-groomed.   Head:  Normocephalic/atraumatic Eyes:  fundi examined, disc margins clear, no obvious papilledema Neck: supple, mild right sided paraspinal tenderness  Neurological Exam: Mental status: alert and oriented, speech fluent and not dysarthric, language intact.  Cranial nerves: CN I: not tested CN II: pupils equal, round and reactive to light, visual fields intact CN III, IV, VI:  full range of motion, no nystagmus, no ptosis CN V: facial sensation intact. CN VII: upper and lower face symmetric CN VIII: hearing intact CN IX, X: uvula midline CN XI: sternocleidomastoid and trapezius muscles intact CN XII: tongue midline  Bulk & Tone: normal, no fasciculations. Motor:  muscle strength 5/5 throughout Deep Tendon Reflexes:  2+ throughout.   Sensation:  Pinprick and vibratory sensation intact. Finger to nose testing:   Without dysmetria.   Gait:  Normal station and stride.  Romberg negative.  Labs and Imaging review: Internal labs: Lab Results  Component Value Date   HGBA1C 5.5 07/05/2018   Lab Results  Component Value Date   [id number] 07/05/2018   Lab Results  Component Value Date   TSH 0.612 07/05/2018   No results found for: "ESRSEDRATE", "POCTSEDRATE"  External labs: TSH - wnl  Imaging:  MRI Brain 09/08/2021 Small T2 FLAIR hyperintense remote insults within the genu of right  internal capsule and posterior right lentiform nucleus (measuring up  to 5 mm), unchanged from the prior brain MRI of 09/02/2019. Although  nonspecific, these could reflect small chronic infarcts.  Otherwise unremarkable MRI appearance of the brain.  Small mucous retention cysts within the right maxillary sinus.   MRI Lumbar 01/28/2020 Normal MRI of the lumbar spine. No findings to explain patient's  symptoms identified.   MRI Brain 09/02/2019 Single 3 mm T2/flair hyperintense focus in the right anterior lateral  thalamus, unchanged in appearance compared to the 2019 MRI. This could represent a small focus of gliosis from chronic ischemic change or other etiology.  There is a single enhancement pattern and no acute findings.   CTA Head and Neck 12/07/2018 Normal CT head  Normal CTA head and neck. Negative for stenosis or dissection. Negative for cerebral aneurysm.   MRI Brain 07/05/2018 Unremarkable MRI brain (with and without). No acute findings.  Stable small (37mm) focus of right thalamic gliosis. No change  from MRI on 02/13/18.   MRI C-spine 02/13/2018 Unremarkable MRI scan of cervical spine with and without  contrast   Assessment/Plan:  Debra Hensley is a 42 y.o. female who presents for evaluation of left facial pain and headaches. She has a relevant medical history of migraine with aura, fibromyalgia, anxiety, depression. Her neurological examination is essentially normal today. Available  diagnostic data is significant for MRI brain from 2023 that shows remote lesions in right basal ganglia/internal capsule/thalamus. Patient's symptoms are likely multifactorial. While there has been suspicion of trigeminal neuralgia in the past,  patient's symptoms last too long to be considered typical for TN. Hemicrania continua is a possibility, but maybe less likely. With symptoms lasting ~3 days, occurring multiple times per month, and being somewhat responsive to rizatriptan, symptoms seem most consistent with migraine with aura, which she has a long standing history of and a strong family history. Her symptoms are likely complicated by medication overuse as well. Thalamic pain syndrome may be contributing, as suspected in the past. Fibromyalgia is also likely contributing.  Patient has tried multiple previous migraine preventative medications and abortive therapies and either had lack of efficacy or side effects. These include: Gabapentin Lyrica Keppra Verapamil Clonazepam Carbamazepine Lamictal Topamax   Indomethacin Rizatriptan Sumatriptan Naproxen Tylenol  She is currently having ~ 4 headaches a month for 3 days each (12 headache days per month) that are very debilitating. As a result, she would benefit for CGRP inhibitor and gepant.   PLAN: -Blood work: B12, ESR -MRI trigeminal nerve protocol  -Would only recommend Cymbalta 120 mg (not 180 mg) -Continue Trileptal 450 mg qhs -Continue Rizatriptan 10 mg PRN until Nurtec is approved -Limit use of pain relievers to no more than 2 days out of week to prevent risk of rebound or medication-overuse headache. -Keep headache diary  Will start getting the following approved: -Aimovig 140 mg injection every 28 days -Nurtec 75 mg PRN  -Return to clinic in 3 months  The impression above as well as the plan as outlined below were extensively discussed with the patient who voiced understanding. All questions were answered to their  satisfaction.  When available, results of the above investigations and possible further recommendations will be communicated to the patient via telephone/MyChart. Patient to call office if not contacted after expected testing turnaround time.   Total time spent reviewing records, interview, history/exam, documentation, and coordination of care on day of encounter:  55 min   Thank you for allowing me to participate in patient's care.  If I can answer any additional questions, I would be pleased to do so.  Kai Levins, MD   CC: Carol Ada, [place] [id number]  CC: Referring provider: Carol Ada, [address] [place],  [place] [id number]

## 2022-07-23 ENCOUNTER — Encounter: Payer: Self-pay | Admitting: Cardiology

## 2022-07-23 ENCOUNTER — Ambulatory Visit: Payer: 59 | Admitting: Cardiology

## 2022-07-23 VITALS — BP 122/80 | HR 112 | Ht 63.0 in | Wt 228.4 lb

## 2022-07-23 DIAGNOSIS — R0683 Snoring: Secondary | ICD-10-CM

## 2022-07-23 DIAGNOSIS — R000 Tachycardia, unspecified: Secondary | ICD-10-CM

## 2022-07-23 DIAGNOSIS — R002 Palpitations: Secondary | ICD-10-CM

## 2022-07-23 DIAGNOSIS — R072 Precordial pain: Secondary | ICD-10-CM

## 2022-07-23 NOTE — Progress Notes (Signed)
ID:  Debra Hensley, DOB [birth year], MRN [id number]  PCP:  Debra Ada, MD  Cardiologist:  Rex Kras, DO, [facility] (established care 07/23/2022) Former Cardiology Providers: none  REASON FOR CONSULT: Tachycardia   REQUESTING PHYSICIAN:  Debra Hensley, [place],  Debra Hensley [id number]  Chief Complaint  Patient presents with   Tachycardia   New Patient (Initial Visit)    HPI  Debra Hensley is a 42 y.o.  female who presents to the clinic for evaluation of longstanding tachycardia at the request of Debra Ada, MD. Her past medical history and cardiovascular risk factors include: obesity   Pt admits she has a fast HR with rates up to 140 bmp when moving, and rates in the 100s at rest.  This has been happening for approx last five year.  She denies any identifiable triggers. She describes the palpitations to as feeling as her heart is coming out of her chest. She denies any syncope. She states these episodes are random and come and go with no pattern.  Associated symptoms include dyspnea at times.    She is also endorsing chest pain. This started within the last few months, and she states the pain is becoming more intense but no change in frequency. Pinpoints the pain to be centralized and radiates to her back and down her left arm. She admits to chest pain with exertion such as climbing stairs or getting upset. She states chest pain does not always go away with resting (but it does help). Last episode of CP was 2 days ago.    She states her dad was diagnosed with heart failure in late 49s., and paternal grandmother died in her 81s from a heart attack.   She does admit to having a prior sleep study done around 14years ago but results were limited because never went to sleep. She admit to snoring, agonal breathing, PND.    FUNCTIONAL STATUS: She is active but does not currently exercise.    ALLERGIES: Allergies  Allergen Reactions   Adhesive [Tape]  Other (See Comments)    BLISTERS   Hydrocodone Itching   Codeine Itching   Percocet [Oxycodone-Acetaminophen] Itching    MEDICATION LIST PRIOR TO VISIT: Current Meds  Medication Sig   acetaminophen (TYLENOL) 500 MG tablet Take 500 mg by mouth every 6 (six) hours as needed.   cholecalciferol (VITAMIN D3) 25 MCG (1000 UNIT) tablet Take 1,000 Units by mouth daily.   DULoxetine (CYMBALTA) 60 MG capsule Take 120 mg by mouth daily.   OXcarbazepine (TRILEPTAL) 150 MG tablet Take 150 mg by mouth 4 (four) times daily. 1 in am and 3 at night   rizatriptan (MAXALT-MLT) 10 MG disintegrating tablet Take 1 tablet (10 mg total) by mouth as needed for migraine. May repeat in 2 hours if needed     PAST MEDICAL HISTORY: Past Medical History:  Diagnosis Date   Breast mass [id number]   left   Complication of anesthesia    states had a problem with her heart rate during UHR, and was hard to wake up post-op; could not be more specific, but was not referred to cardiologist   Fibromyalgia    Migraine    Mood disorder ([place])    Obesity    Tachycardia    states heart rate has been high for last 3 doctor visits, has not been referred to cardiolgist or endocrinologist    PAST SURGICAL HISTORY: Past Surgical History:  Procedure Laterality Date  BREAST EXCISIONAL BIOPSY Left    2015   BREAST LUMPECTOMY WITH RADIOACTIVE SEED LOCALIZATION Left 05/15/2014   Procedure: LEFT BREAST LUMPECTOMY WITH RADIOACTIVE SEED LOCALIZATION;  Surgeon: Fanny Skates, MD;  Location: [place];  Service: General;  Laterality: Left;   CHOLECYSTECTOMY  08/01/2010   DILATION AND CURETTAGE OF UTERUS  01/06/2002   ELBOW FRACTURE SURGERY Right    age 54   NASAL TURBINATE REDUCTION     TONSILLECTOMY  [id number]   UMBILICAL HERNIA REPAIR  [id number]   UMBILICAL HERNIA REPAIR  2010    FAMILY HISTORY: The patient family history includes Heart attack in her maternal grandfather and paternal grandmother; Heart disease in her paternal  grandfather; Hypertension in her father and mother; Hyperthyroidism in her sister; Migraines in her mother and son; Polycystic ovary syndrome in her sister.  SOCIAL HISTORY:  The patient  reports that she has never smoked. She has never used smokeless tobacco. She reports current alcohol use. She reports that she does not use drugs.  REVIEW OF SYSTEMS: Review of Systems  Cardiovascular:  Positive for chest pain, dyspnea on exertion, palpitations and paroxysmal nocturnal dyspnea. Negative for claudication, irregular heartbeat, leg swelling, near-syncope, orthopnea and syncope.  Respiratory:  Positive for shortness of breath, sleep disturbances due to breathing and snoring.   Hematologic/Lymphatic: Negative for bleeding problem.  Musculoskeletal:  Negative for muscle cramps and myalgias.  Neurological:  Negative for dizziness and light-headedness.    PHYSICAL EXAM:    07/23/2022   12:50 PM 07/25/2019    8:18 AM 01/25/2019    2:03 PM  Vitals with BMI  Height 5\' 3"  5\' 3"  5\' 3"   Weight 228 lbs 6 oz 214 lbs 209 lbs 10 oz  BMI 40.47 Q000111Q [id number]  Systolic 123XX123 Q000111Q [id number]  Diastolic 80 98 78  Pulse [id number] 96 80    Physical Exam  Constitutional: No distress.  Age appropriate, hemodynamically stable.   Neck: No JVD present.  Cardiovascular: Normal rate, regular rhythm, S1 normal, S2 normal, intact distal pulses and normal pulses. Exam reveals no gallop, no S3 and no S4.  No murmur heard. Pulmonary/Chest: Effort normal and breath sounds normal. No stridor. She has no wheezes. She has no rales.  Abdominal: Soft. Bowel sounds are normal. She exhibits no distension. There is no abdominal tenderness.  Musculoskeletal:        General: No edema.     Cervical back: Neck supple.  Neurological: She is alert and oriented to person, place, and time. She has intact cranial nerves (2-12).  Skin: Skin is warm and moist.   CARDIAC DATABASE: EKG: 07/23/2022: Sinus tachycardia, 102 bpm, without underlying  ischemia or injury pattern no prior EKGs available for comparison.  Echocardiogram: No results found for this or any previous visit from the past 1095 days.    Stress Testing: No results found for this or any previous visit from the past 1095 days.   Heart Catheterization: None  LABORATORY DATA:  External Labs 07/08/2022 - [facility] H/H 14.2/43.2. MCV 90.6. Platelets 332 TSH 1.41  External Labs 11/27/2021 - [facility] Glucose 92, BUN/Cr 13/0.83. EGFR 91. Na/K 139/4.4. Rest of the CMP normal   Lipid Panel  No results found for: "CHOL", "TRIG", "HDL", "CHOLHDL", "VLDL", "LDLCALC", "LDLDIRECT", "LABVLDL"  No components found for: "NTPROBNP" No results for input(s): "PROBNP" in the last 8760 hours. No results for input(s): "TSH" in the last 8760 hours.  BMP No results for input(s): "NA", "K", "CL", "CO2", "  GLUCOSE", "BUN", "CREATININE", "CALCIUM", "GFRNONAA", "GFRAA" in the last 8760 hours.  HEMOGLOBIN A1C Lab Results  Component Value Date   HGBA1C 5.5 07/05/2018    IMPRESSION:    ICD-10-CM   1. Precordial pain  R07.2 PCV ECHOCARDIOGRAM COMPLETE    PCV CARDIAC STRESS TEST    CT CARDIAC SCORING (SELF PAY ONLY)    CANCELED: CT CARDIAC SCORING ([facility] LOCATIONS ONLY)    CANCELED: CT CARDIAC SCORING (SELF PAY ONLY)    2. Tachycardia  R00.0 EKG 12-Lead    3. Palpitations  R00.2     4. Snoring  R06.83 Ambulatory referral to Sleep Studies    5. Class 3 severe obesity due to excess calories without serious comorbidity with body mass index (BMI) of 40.0 to 44.9 in adult [facility])  E66.01    Z68.41        RECOMMENDATIONS: ANNIKAH DELACUEVA is a 42 y.o.  female whose past medical history and cardiac risk factors include: obesity.   Precordial pain Mixed cardiac & noncardiac features  Echo will be ordered to evaluate for structural heart disease and left ventricular systolic function.   Exercise treadmill stress test to evaluate for functional capacity and  exercise-induced arrhythmia/ischemia. Schedule CT Coronary Calcium Score for further risk stratification Educated on seeking medical attention sooner by going to the closest ER via EMS if the symptoms increase in intensity, frequency, duration, or has typical chest pain as discussed in the office.  Patient verbalized understanding.  Tachycardia Palpitations EKG personally reviewed: Sinus Tachycardia without underlying ischemia injury No identifiable reversible cause Ongoing for several years No near-syncope or syncopal event After ischemic workup is complete we will readdress Zio patch Could consider Zio patch however, given that randomness of frequency, not sure it will be truly diagnostic. We will hold off on the monitor for now, and if symptoms increase or change, we can re-evaluate.  Snoring Her symptoms of snoring, episodes of apnea by loved ones and family members, and occasional PND concerning for untreated sleep apnea. Referral to Sleep Study  Obesity Body mass index is 40.46 kg/m. I reviewed with her importance of diet, regular physical activity/exercise, weight loss.   Patient is educated on the importance of increasing physical activity gradually as tolerated.   Until the workup for chest pain has not been completed she is requested not to overexert herself. Encouraged that she follows up with PCP and get screened for lipids as well as diabetes.  Data Reviewed: I have independently reviewed external notes provided by the referring provider as part of this office visit.   I have independently reviewed results of EKG, labs, as part of medical decision making. I have ordered the following tests:  Orders Placed This Encounter  Procedures   CT CARDIAC SCORING (SELF PAY ONLY)    Standing Status:   Future    Standing Expiration Date:   09/22/2022    Order Specific Question:   Is patient pregnant?    Answer:   No    Order Specific Question:   Preferred imaging location?     Answer:   External   Ambulatory referral to Sleep Studies    Referral Priority:   Routine    Referral Type:   Consultation    Referral Reason:   Specialty Services Required    Referred to Provider:   Dohmeier, Asencion Partridge, MD    Number of Visits Requested:   1   PCV CARDIAC STRESS TEST    Standing Status:   Future  Standing Expiration Date:   09/22/2022   EKG 12-Lead   PCV ECHOCARDIOGRAM COMPLETE    Standing Status:   Future    Standing Expiration Date:   07/23/2023   FINAL MEDICATION LIST END OF ENCOUNTER: No orders of the defined types were placed in this encounter.   Medications Discontinued During This Encounter  Medication Reason   carbamazepine (TEGRETOL) 200 MG tablet Change in therapy   gabapentin (NEURONTIN) 300 MG capsule Completed Course   lamoTRIgine (LAMICTAL) 200 MG tablet Completed Course   promethazine (PHENERGAN) 25 MG tablet Completed Course   topiramate (TOPAMAX) 100 MG tablet Completed Course     Current Outpatient Medications:    acetaminophen (TYLENOL) 500 MG tablet, Take 500 mg by mouth every 6 (six) hours as needed., Disp: , Rfl:    cholecalciferol (VITAMIN D3) 25 MCG (1000 UNIT) tablet, Take 1,000 Units by mouth daily., Disp: , Rfl:    DULoxetine (CYMBALTA) 60 MG capsule, Take 120 mg by mouth daily., Disp: , Rfl: 5   OXcarbazepine (TRILEPTAL) 150 MG tablet, Take 150 mg by mouth 4 (four) times daily. 1 in am and 3 at night, Disp: , Rfl:    rizatriptan (MAXALT-MLT) 10 MG disintegrating tablet, Take 1 tablet (10 mg total) by mouth as needed for migraine. May repeat in 2 hours if needed, Disp: 9 tablet, Rfl: 11  Orders Placed This Encounter  Procedures   CT CARDIAC SCORING (SELF PAY ONLY)   Ambulatory referral to Sleep Studies   PCV CARDIAC STRESS TEST   EKG 12-Lead   PCV ECHOCARDIOGRAM COMPLETE    There are no Patient Instructions on file for this visit.   --Continue cardiac medications as reconciled in final medication list. --Return in about 6 weeks  (around 09/03/2022) for re-evaluation of CP and review test results. or sooner if needed. --Continue follow-up with your primary care physician regarding the management of your other chronic comorbid conditions.  Patient's questions and concerns were addressed to her satisfaction. She voices understanding of the instructions provided during this encounter.   This note was created using a voice recognition software as a result there may be grammatical errors inadvertently enclosed that do not reflect the nature of this encounter. Every attempt is made to correct such errors.  Rex Kras, [place], [facility]  Pager:  [phone] Office: [phone]

## 2022-07-29 ENCOUNTER — Encounter: Payer: Self-pay | Admitting: Neurology

## 2022-07-29 ENCOUNTER — Ambulatory Visit (INDEPENDENT_AMBULATORY_CARE_PROVIDER_SITE_OTHER): Payer: 59 | Admitting: Neurology

## 2022-07-29 ENCOUNTER — Other Ambulatory Visit (INDEPENDENT_AMBULATORY_CARE_PROVIDER_SITE_OTHER): Payer: 59

## 2022-07-29 ENCOUNTER — Telehealth: Payer: Self-pay

## 2022-07-29 VITALS — BP 132/88 | HR 106 | Ht 63.0 in | Wt 231.0 lb

## 2022-07-29 DIAGNOSIS — R519 Headache, unspecified: Secondary | ICD-10-CM | POA: Diagnosis not present

## 2022-07-29 DIAGNOSIS — M797 Fibromyalgia: Secondary | ICD-10-CM

## 2022-07-29 DIAGNOSIS — G43109 Migraine with aura, not intractable, without status migrainosus: Secondary | ICD-10-CM

## 2022-07-29 DIAGNOSIS — R251 Tremor, unspecified: Secondary | ICD-10-CM

## 2022-07-29 LAB — VITAMIN B12: Vitamin B-12: 341 pg/mL (ref 211–911)

## 2022-07-29 LAB — SEDIMENTATION RATE: Sed Rate: 11 mm/hr (ref 0–20)

## 2022-07-29 MED ORDER — NURTEC 75 MG PO TBDP: 75.0000 mg | ORAL_TABLET | ORAL | 11 refills | Status: DC | PRN

## 2022-07-29 MED ORDER — AIMOVIG 140 MG/ML ~~LOC~~ SOAJ: 140.0000 mg | SUBCUTANEOUS | 11 refills | Status: DC

## 2022-07-29 NOTE — Addendum Note (Signed)
Addended by: Renae Gloss on: 07/29/2022 02:01 PM   Modules accepted: Orders

## 2022-07-29 NOTE — Patient Instructions (Addendum)
I think your symptoms may be related to migraine. Trigeminal neuralgia or hemicrania continua are other possibilities, but maybe less likely based on the pattern of headache.  I would like to investigate with the following: -Blood work: B12, ESR -MRI trigeminal nerve protocol  I will be in touch when I have the results.  For your pain: -Would only recommend Cymbalta 120 mg (not 180 mg) at bedtime -Continue Trileptal 450 mg at bedtime -Continue Rizatriptan 10 mg as needed until Nurtec is approved -Limit use of pain relievers to no more than 2 days out of week to prevent risk of rebound or medication-overuse headache. -Keep headache diary  Will start getting the following approved: -Aimovig 140 mg injection every 28 days for migraine prevention -Nurtec 75 mg as needed for acute migraines  I will see you back in clinic in about 3 months to check on your symptoms. Please let me know if you have any questions or concerns in the meantime.   The physicians and staff at [facility] are committed to providing excellent care. You may receive a survey requesting feedback about your experience at our office. We strive to receive "very good" responses to the survey questions. If you feel that your experience would prevent you from giving the office a "very good " response, please contact our office to try to remedy the situation. We may be reached at [phone]. Thank you for taking the time out of your busy day to complete the survey.  Kai Levins, MD [facility]

## 2022-08-07 ENCOUNTER — Telehealth: Payer: Self-pay | Admitting: Pharmacy Technician

## 2022-08-07 ENCOUNTER — Other Ambulatory Visit (HOSPITAL_COMMUNITY): Payer: Self-pay

## 2022-08-07 NOTE — Telephone Encounter (Signed)
Patient Advocate Encounter  Received notification from [employer] that prior authorization for NURTEC 75MG  is required.   PA submitted on 4.5.24 AS EXPEDITED Key [id number] Status is pending

## 2022-08-07 NOTE — Telephone Encounter (Signed)
Patient Advocate Encounter  Received notification from [employer] that prior authorization for AIMOVIG 140MG  is required.   PA submitted on 4.5.24 AS EXPEDITED Key [id number] Status is pending

## 2022-08-07 NOTE — Telephone Encounter (Signed)
Received notification from [facility] regarding a prior authorization for AIMOVIG 140MG . Authorization has been APPROVED from 4.5.24 to 10.5.24.   Per test claim, copay for 28 days supply is $35  Authorization # [id number]

## 2022-08-10 ENCOUNTER — Other Ambulatory Visit (HOSPITAL_COMMUNITY): Payer: Self-pay

## 2022-08-10 NOTE — Telephone Encounter (Signed)
Patient Advocate Encounter  Prior Authorization for NURTEC 75MG  has been approved.    PA#  [id number] Effective dates: 4.5.24 through 7.5.24

## 2022-08-11 ENCOUNTER — Other Ambulatory Visit: Payer: Self-pay

## 2022-08-11 DIAGNOSIS — G43109 Migraine with aura, not intractable, without status migrainosus: Secondary | ICD-10-CM

## 2022-08-11 MED ORDER — NURTEC 75 MG PO TBDP
75.0000 mg | ORAL_TABLET | ORAL | 11 refills | Status: DC | PRN
Start: 2022-08-11 — End: 2022-08-12

## 2022-08-11 NOTE — Telephone Encounter (Signed)
Scripted was changed to 8 pill per 30 days for Nurtec.

## 2022-08-12 ENCOUNTER — Other Ambulatory Visit: Payer: Self-pay

## 2022-08-12 DIAGNOSIS — G43109 Migraine with aura, not intractable, without status migrainosus: Secondary | ICD-10-CM

## 2022-08-12 MED ORDER — NURTEC 75 MG PO TBDP
75.0000 mg | ORAL_TABLET | ORAL | 11 refills | Status: DC | PRN
Start: 2022-08-12 — End: 2023-07-08

## 2022-08-17 ENCOUNTER — Ambulatory Visit: Payer: 59

## 2022-08-17 DIAGNOSIS — R072 Precordial pain: Secondary | ICD-10-CM

## 2022-08-18 ENCOUNTER — Encounter: Payer: Self-pay | Admitting: Neurology

## 2022-08-18 ENCOUNTER — Institutional Professional Consult (permissible substitution): Payer: Commercial Managed Care - PPO | Admitting: Neurology

## 2022-09-04 ENCOUNTER — Ambulatory Visit: Payer: Self-pay | Admitting: Cardiology

## 2022-09-07 ENCOUNTER — Telehealth: Payer: Self-pay

## 2022-09-07 NOTE — Telephone Encounter (Signed)
[facility] noted they called pt on 4/1 and 4/8. And Left a message but I spoke with pt and she said she never receive a message. I asked her to call and she is going to and call us if she needs.

## 2022-10-08 ENCOUNTER — Other Ambulatory Visit (HOSPITAL_COMMUNITY): Payer: Self-pay

## 2022-10-08 ENCOUNTER — Telehealth: Payer: Self-pay

## 2022-10-08 NOTE — Telephone Encounter (Signed)
ERROR

## 2022-10-14 NOTE — Progress Notes (Deleted)
NEUROLOGY FOLLOW UP OFFICE NOTE  Debra Hensley [id number]  Subjective:  Debra Hensley is a 42 y.o. year old right-handed female with a medical history of migraine with aura, fibromyalgia, anxiety, depression who we last saw on 07/29/22.  To briefly review: Patient has a long history of migraines since childhood and a strong family history of migraines. About 5 years ago, (?2017) she started having mostly left face pain (very rare on right). It feels like she has pain from left ear to jaw to nose then eye. It can be triggered by crying, brushing her teeth, chewing the wrong way, or stress. She describes a sharp stabbing pain that becomes constant for up to 3 days (does not quickly come and go). She has phonophobia, but not clear photophobia. She denies nausea or vomiting. It will hurt for her to talk. She has to stay in bed.   Rizatriptan can help. It can make symptoms go away in about 30 minutes, but symptoms return within a few hours. She will take 9-10 rizatriptans in a 3 day period when her symptoms get bad. She will also take tylenol, 600 mg every 4-6 hours during her 3 day headaches.   For prevention, she is currently on Cymbalta 180 mg qhs (prescribed 120 mg) and trileptal 450 mg qhs (prescribed 150 mg in am and 450 mg qhs). She has tried many previous medications, see below. She has not previously tried botox, CGRP inhibitors, or gepants.    She will get a 3 day pain episode once per week or once every other week (2-4 episodes per month).   Patient has previously been seen by multiple neurologists. In 2017, Dr. Marjory Lies at [facility] mentioned migraines with aura since the age of 45. Notes first mention left face pain associated with pain in eye, neck, and shoulder as well in 2019. MRI on 02/13/18 showed small right thalamic gliosis. Patient was diagnosed with thalamic pain syndrome.   Patient was then seen virtually at [facility] in 2021. Their impression was that patient had an atypical form of  trigeminal neuralgia as there was jabbing and stabbing pain in left V1-V3 distribution that constantly repeats and can last 3-4 days.   Patient was seen at [facility] in 2022 for essential tremor. Per 10/23/2021 note from Dr. Dina Rich: "Exam had findings of mild finger myoclonus and I suspect her jerking during sleep is nocturnal myoclonus. She also had a tremor of variable frequency with distractibility and entrainment suggestive of a functional component. We discontinued clonazepam as she had daytime sleepiness with no benefit to myoclonus. Since her last visit, the patient initiated Keppra 250 mg bid without improvement in myoclonus and with increased eczema flare-ups. For various reasons she self-discontinued Keppra and all other medications - with the exceptions of Trileptal and Cymbalta - with subsequent worsening of her trigeminal neuralgia which, when poorly controlled, is significantly more bothersome than her abnormal movements. She was seen by Dr. Vonna Kotyk for her headaches around 2 weeks ago who recommended successive titration of Trileptal, verapamil, and tizanidine. Given that she is in the midst of adjusting several medications for headaches, we will defer trialing any medications for myoclonus at this time. We discussed Depakote for future consideration. I have counseled on CBT-based therapy for the functional component."   Patient was last seen by Dr. Langston Masker at [facility] [place] on 04/14/22. Per that clinic note: Patient states that about 3 and half years ago, she started having episodes of right-sided facial pain that would originate behind the  right ear and radiated up into the right orbit and cheek area. Pain would be so severe that she described it as a hot poker and sharp in nature. Typically these pain sensations would last about 3 days before they would resolve. The only thing that would alleviate the pain even remotely would be taking multiple doses of rizatriptan (more than prescribed). Triggers would include  stress, being tired and brushing her teeth. Has undergone imaging studies that did not show any acute abnormalities that would be contributing to the pain. Initially was seen neurology who thought this was more of a migraine variant, but then later she was seen at [facility] where she was told this was more related to trigeminal neuralgia. Has not noted any clear of facial flushing, tearing or rhinitis when these occur. Sometimes feels like she will be drooling when the pain is at its worst. Also has been noticing other issues where she will have some tremors at times, losing her balance issues with walking and getting her words out. Dors is some tingling of both feet and hands, but more than anything, the pain in the face seems to be the most prominent issue for her today. Has tried numerous medications in the past and stated that a lot of these were discontinued on her own secondary to side effects can try and remain functional. At present, taking oxcarbazepine 450 mg at night, but was not able to tolerate this as much during the day secondary to sedation.  Previous medications tried Trileptal Cymbalta Gabapentin Lyrica Indomethacin Rizatriptan Sumatriptan Keppra Verapamil Sumatriptan hand Naproxen Clonazepam Carbamazepine Lamictal Topamax    Assessment  42 y.o. female with PMHx as above who presents for evaluation of trigeminal neuralgia. Patient's neurologic exam is nonfocal and nonlateralizing, which is reassuring. At this point, it is unclear how much of this is actually related to her trigeminal neuralgia versus an atypical headache disorder, such as hemicrania continua. Generally with trigeminal neuralgia at the symptoms are more persistent. And, with a headache disorders, they still can involve the trigeminal nerve. She is already tried multiple medications and had several different side effects. At this point, I do not think there is anything else additional that I would be able to offer that  has not already been tried. May benefit from seeing a dedicated headache provider or pain specialist. Injections might be another option.  Plan   Trigeminal neuralgia versus atypical headache disorder Already tried and failed numerous different medications May continue with the Cymbalta and Trileptal Referral to headache specialist Could also consider referral to pain specialist    Patient has a family, does not wish to have more children, and currently has an IUD in place.   She does not drink caffeine. She drinks EtOH only rarely. She does not smoke.   Patient also mentions various other symptoms: -She shakes all over all the time. -She has difficulty getting her words out. No clear pattern. -Memory difficulties -Scalp hurts to touch -Neck pain -Difficulty controlling bladder (no difficulty with bowels) -Tingling in arms and legs, no pattern or triggers, but comes and goes frequently - wonders if it is medication related  Most recent Assessment and Plan (07/29/22): Debra Hensley is a 42 y.o. female who presents for evaluation of left facial pain and headaches. She has a relevant medical history of migraine with aura, fibromyalgia, anxiety, depression. Her neurological examination is essentially normal today. Available diagnostic data is significant for MRI brain from 2023 that shows remote lesions in right  basal ganglia/internal capsule/thalamus. Patient's symptoms are likely multifactorial. While there has been suspicion of trigeminal neuralgia in the past, patient's symptoms last too long to be considered typical for TN. Hemicrania continua is a possibility, but maybe less likely. With symptoms lasting ~3 days, occurring multiple times per month, and being somewhat responsive to rizatriptan, symptoms seem most consistent with migraine with aura, which she has a long standing history of and a strong family history. Her symptoms are likely complicated by medication overuse as well.  Thalamic pain syndrome may be contributing, as suspected in the past. Fibromyalgia is also likely contributing.   Patient has tried multiple previous migraine preventative medications and abortive therapies and either had lack of efficacy or side effects. These include: Gabapentin Lyrica Keppra Verapamil Clonazepam Carbamazepine Lamictal Topamax    Indomethacin Rizatriptan Sumatriptan Naproxen Tylenol   She is currently having ~ 4 headaches a month for 3 days each (12 headache days per month) that are very debilitating. As a result, she would benefit for CGRP inhibitor and gepant.  PLAN: -Blood work: B12, ESR -MRI trigeminal nerve protocol   -Would only recommend Cymbalta 120 mg (not 180 mg) -Continue Trileptal 450 mg qhs -Continue Rizatriptan 10 mg PRN until Nurtec is approved -Limit use of pain relievers to no more than 2 days out of week to prevent risk of rebound or medication-overuse headache. -Keep headache diary   Will start getting the following approved: -Aimovig 140 mg injection every 28 days -Nurtec 75 mg PRN  Since their last visit: Nurtec and Aimovig were approved (08/10/22 and 08/07/22 respectively). ***  She still has not had the MRI trigeminal nerve protocol. ***  ESR was normal. B12 was borderline low, for which I recommended B12 1000 mcg daily.  Current medications:*** -Trileptal 450 mg qhs -Cymbalta*** -Aimovig q28 days -Nurtec PRN   MEDICATIONS:  Outpatient Encounter Medications as of 10/21/2022  Medication Sig   acetaminophen (TYLENOL) 500 MG tablet Take 500 mg by mouth every 6 (six) hours as needed.   ALPRAZolam (XANAX) 0.5 MG tablet Take 0.5 mg by mouth at bedtime as needed.   ARIPiprazole (ABILIFY) 10 MG tablet TAKE 1 TABLET BY MOUTH ONCE DAILY FOR 30 DAYS   cholecalciferol (VITAMIN D3) 25 MCG (1000 UNIT) tablet Take 1,000 Units by mouth daily.   DULoxetine (CYMBALTA) 60 MG capsule Take 120 mg by mouth daily.   Erenumab-aooe (AIMOVIG) 140  MG/ML SOAJ Inject 140 mg into the skin every 28 (twenty-eight) days.   OXcarbazepine (TRILEPTAL) 150 MG tablet Take 150 mg by mouth 4 (four) times daily. 1 in am and 3 at night   Rimegepant Sulfate (NURTEC) 75 MG TBDP Take 1 tablet (75 mg total) by mouth as needed (for first on set of migraine only one time within 24 hour).   rizatriptan (MAXALT-MLT) 10 MG disintegrating tablet Take 1 tablet (10 mg total) by mouth as needed for migraine. May repeat in 2 hours if needed   No facility-administered encounter medications on file as of 10/21/2022.    PAST MEDICAL HISTORY: Past Medical History:  Diagnosis Date   Breast mass 05/2014   left   Complication of anesthesia    states had a problem with her heart rate during UHR, and was hard to wake up post-op; could not be more specific, but was not referred to cardiologist   Fibromyalgia    Migraine    Mood disorder (HCC)    Obesity    Tachycardia    states heart rate has been  high for last 3 doctor visits, has not been referred to cardiolgist or endocrinologist    PAST SURGICAL HISTORY: Past Surgical History:  Procedure Laterality Date   BREAST EXCISIONAL BIOPSY Left    2015   BREAST LUMPECTOMY WITH RADIOACTIVE SEED LOCALIZATION Left 05/15/2014   Procedure: LEFT BREAST LUMPECTOMY WITH RADIOACTIVE SEED LOCALIZATION;  Surgeon: Claud Kelp, MD;  Location: [facility];  Service: General;  Laterality: Left;   CHOLECYSTECTOMY  08/01/2010   DILATION AND CURETTAGE OF UTERUS  01/06/2002   ELBOW FRACTURE SURGERY Right    age 28   NASAL TURBINATE REDUCTION     TONSILLECTOMY  1989   UMBILICAL HERNIA REPAIR  2010   UMBILICAL HERNIA REPAIR  2010    ALLERGIES: Allergies  Allergen Reactions   Adhesive [Tape] Other (See Comments)    BLISTERS   Hydrocodone Itching   Codeine Itching   Percocet [Oxycodone-Acetaminophen] Itching    FAMILY HISTORY: Family History  Problem Relation Age of Onset   Migraines Mother    Hypertension Mother     Hypertension Father    Hyperthyroidism Sister    Polycystic ovary syndrome Sister    Heart attack Maternal Grandfather    Heart attack Paternal Grandmother    Heart disease Paternal Grandfather    Migraines Son     SOCIAL HISTORY: Social History   Tobacco Use   Smoking status: Never   Smokeless tobacco: Never  Vaping Use   Vaping Use: Never used  Substance Use Topics   Alcohol use: Yes    Comment: rarely   Drug use: No   Social History   Social History Narrative   Lives at home with husband and 3 kids   Education -    Caffeine use: 1 cup daily   Are you right handed or left handed? right   Are you currently employed ? yes   What is your current occupation?   Do you live at home alone? no   Who lives with you? Husband and children   What type of home do you live in: 1 story or 2 story? one          Objective:  Vital Signs:  There were no vitals taken for this visit.  ***  Labs and Imaging review: New results: 07/29/22: ESR wnl B12: 341  Previously reviewed results: Lab Results  Component Value Date    HGBA1C 5.5 07/05/2018      Recent Labs       Lab Results  Component Value Date    VITAMINB12 486 07/05/2018      Recent Labs       Lab Results  Component Value Date    TSH 0.612 07/05/2018      Recent Labs[] Expand by Default  No results found for: "ESRSEDRATE", "POCTSEDRATE"     External labs: TSH - wnl   Imaging:  MRI Brain 09/08/2021 Small T2 FLAIR hyperintense remote insults within the genu of right  internal capsule and posterior right lentiform nucleus (measuring up  to 5 mm), unchanged from the prior brain MRI of 09/02/2019. Although  nonspecific, these could reflect small chronic infarcts.  Otherwise unremarkable MRI appearance of the brain.  Small mucous retention cysts within the right maxillary sinus.   MRI Lumbar 01/28/2020 Normal MRI of the lumbar spine. No findings to explain patient's  symptoms identified.   MRI Brain  09/02/2019 Single 3 mm T2/flair hyperintense focus in the right anterior lateral  thalamus, unchanged in appearance compared to  the 2019 MRI. This could represent a small focus of gliosis from chronic ischemic change or other etiology.  There is a single enhancement pattern and no acute findings.   CTA Head and Neck 12/07/2018 Normal CT head  Normal CTA head and neck. Negative for stenosis or dissection. Negative for cerebral aneurysm.   MRI Brain 07/05/2018 Unremarkable MRI brain (with and without). No acute findings.  Stable small (3mm) focus of right thalamic gliosis. No change  from MRI on 02/13/18.   MRI C-spine 02/13/2018 Unremarkable MRI scan of cervical spine with and without  contrast   Assessment/Plan:  This is Debra Hensley, a 42 y.o. female with: ***   Plan: ***  Return to clinic in ***  Total time spent reviewing records, interview, history/exam, documentation, and coordination of care on day of encounter:  *** min  Jacquelyne Balint, MD

## 2022-10-19 ENCOUNTER — Other Ambulatory Visit (HOSPITAL_COMMUNITY): Payer: Self-pay

## 2022-10-19 ENCOUNTER — Telehealth: Payer: Self-pay | Admitting: Pharmacy Technician

## 2022-10-19 NOTE — Telephone Encounter (Signed)
Patient Advocate Encounter  Received notification from [employer] that prior authorization for NURTEC 75MG  is required.   PA submitted on 6.17.24 Key [id number] Status is pending

## 2022-10-21 ENCOUNTER — Encounter: Payer: Self-pay | Admitting: Neurology

## 2022-10-21 ENCOUNTER — Ambulatory Visit: Payer: 59 | Admitting: Neurology

## 2022-10-21 DIAGNOSIS — Z029 Encounter for administrative examinations, unspecified: Secondary | ICD-10-CM

## 2022-11-19 ENCOUNTER — Emergency Department (HOSPITAL_COMMUNITY): Payer: 59

## 2022-11-19 ENCOUNTER — Ambulatory Visit
Admission: EM | Admit: 2022-11-19 | Discharge: 2022-11-19 | Disposition: A | Discharge status: Home / Self Care | Payer: 59

## 2022-11-19 ENCOUNTER — Encounter (HOSPITAL_COMMUNITY): Payer: Self-pay

## 2022-11-19 ENCOUNTER — Encounter: Payer: Self-pay | Admitting: *Deleted

## 2022-11-19 ENCOUNTER — Emergency Department (HOSPITAL_COMMUNITY)
Admission: EM | Admit: 2022-11-19 | Discharge: 2022-11-20 | Disposition: A | Discharge status: Home / Self Care | Payer: 59 | Attending: Emergency Medicine | Admitting: Emergency Medicine

## 2022-11-19 ENCOUNTER — Other Ambulatory Visit: Payer: Self-pay

## 2022-11-19 DIAGNOSIS — R55 Syncope and collapse: Secondary | ICD-10-CM | POA: Diagnosis not present

## 2022-11-19 DIAGNOSIS — R42 Dizziness and giddiness: Secondary | ICD-10-CM

## 2022-11-19 DIAGNOSIS — R202 Paresthesia of skin: Secondary | ICD-10-CM | POA: Insufficient documentation

## 2022-11-19 DIAGNOSIS — R519 Headache, unspecified: Secondary | ICD-10-CM

## 2022-11-19 DIAGNOSIS — R197 Diarrhea, unspecified: Secondary | ICD-10-CM

## 2022-11-19 DIAGNOSIS — R531 Weakness: Secondary | ICD-10-CM | POA: Diagnosis present

## 2022-11-19 DIAGNOSIS — M62838 Other muscle spasm: Secondary | ICD-10-CM | POA: Diagnosis not present

## 2022-11-19 HISTORY — DX: Trigeminal neuralgia: G50.0

## 2022-11-19 HISTORY — DX: Disorder of brain, unspecified: G93.9

## 2022-11-19 LAB — DIFFERENTIAL
Abs Immature Granulocytes: 0.02 10*3/uL (ref 0.00–0.07)
Basophils Absolute: 0.1 10*3/uL (ref 0.0–0.1)
Basophils Relative: 1 %
Eosinophils Absolute: 0.4 10*3/uL (ref 0.0–0.5)
Eosinophils Relative: 4 %
Immature Granulocytes: 0 %
Lymphocytes Relative: 30 %
Lymphs Abs: 2.8 10*3/uL (ref 0.7–4.0)
Monocytes Absolute: 0.6 10*3/uL (ref 0.1–1.0)
Monocytes Relative: 6 %
Neutro Abs: 5.7 10*3/uL (ref 1.7–7.7)
Neutrophils Relative %: 59 %

## 2022-11-19 LAB — COMPREHENSIVE METABOLIC PANEL
ALT: 23 U/L (ref 0–44)
AST: 19 U/L (ref 15–41)
Albumin: 4.2 g/dL (ref 3.5–5.0)
Alkaline Phosphatase: 79 U/L (ref 38–126)
Anion gap: 9 (ref 5–15)
BUN: 15 mg/dL (ref 6–20)
CO2: 22 mmol/L (ref 22–32)
Calcium: 8.8 mg/dL — ABNORMAL LOW (ref 8.9–10.3)
Chloride: 107 mmol/L (ref 98–111)
Creatinine, Ser: 0.95 mg/dL (ref 0.44–1.00)
GFR, Estimated: >60 mL/min (ref >60)
Glucose, Bld: 108 mg/dL — ABNORMAL HIGH (ref 70–99)
Potassium: 3.5 mmol/L (ref 3.5–5.1)
Sodium: 138 mmol/L (ref 135–145)
Total Bilirubin: 0.9 mg/dL (ref 0.3–1.2)
Total Protein: 7.3 g/dL (ref 6.5–8.1)

## 2022-11-19 LAB — CBC
HCT: 44.1 % (ref 36.0–46.0)
Hemoglobin: 15 g/dL (ref 12.0–15.0)
MCH: 31.4 pg (ref 26.0–34.0)
MCHC: 34 g/dL (ref 30.0–36.0)
MCV: 92.5 fL (ref 80.0–100.0)
Platelets: 339 10*3/uL (ref 150–400)
RBC: 4.77 MIL/uL (ref 3.87–5.11)
RDW: 12.4 % (ref 11.5–15.5)
WBC: 9.6 10*3/uL (ref 4.0–10.5)
nRBC: 0 % (ref 0.0–0.2)

## 2022-11-19 LAB — I-STAT CHEM 8, ED
BUN: 16 mg/dL (ref 6–20)
Calcium, Ion: 1.11 mmol/L — ABNORMAL LOW (ref 1.15–1.40)
Chloride: 107 mmol/L (ref 98–111)
Creatinine, Ser: 0.9 mg/dL (ref 0.44–1.00)
Glucose, Bld: 110 mg/dL — ABNORMAL HIGH (ref 70–99)
HCT: 44 % (ref 36.0–46.0)
Hemoglobin: 15 g/dL (ref 12.0–15.0)
Potassium: 3.6 mmol/L (ref 3.5–5.1)
Sodium: 139 mmol/L (ref 135–145)
TCO2: 21 mmol/L — ABNORMAL LOW (ref 22–32)

## 2022-11-19 LAB — ETHANOL: Alcohol, Ethyl (B): <10 mg/dL (ref <10)

## 2022-11-19 LAB — PROTIME-INR
INR: 1 (ref 0.8–1.2)
Prothrombin Time: 13.2 seconds (ref 11.4–15.2)

## 2022-11-19 LAB — APTT: aPTT: 26 seconds (ref 24–36)

## 2022-11-19 LAB — CBG MONITORING, ED: Glucose-Capillary: 98 mg/dL (ref 70–99)

## 2022-11-19 LAB — HCG, SERUM, QUALITATIVE: Preg, Serum: NEGATIVE

## 2022-11-19 MED ORDER — ALPRAZOLAM 0.25 MG PO TABS
0.5000 mg | ORAL_TABLET | Freq: Once | ORAL | Status: AC
  Administered 2022-11-19: 0.5 mg via ORAL
  Filled 2022-11-19: qty 2

## 2022-11-19 MED ORDER — SODIUM CHLORIDE 0.9% FLUSH: 3.0000 mL | Freq: Once | INTRAVENOUS | Status: DC

## 2022-11-19 NOTE — ED Provider Notes (Signed)
[facility]    CSN: [id number] Arrival date & time: 11/19/22  1130      History   Chief Complaint No chief complaint on file.   HPI Debra Hensley is a 42 y.o. female.   Patient presents with tingling throughout entire body, right-sided weakness, dizziness, acute headache, diarrhea, floaters that all started last night around 10:30 PM.  Patient reports that she started having sharp stomach pain around this time so she went to sit on the toilet.  Reports that she had "explosive diarrhea" with no blood in her stool.  Prior to the diarrhea, she started having significant diaphoresis and generally not feeling well.  Then, she developed tingling throughout her arms that then extended into her legs.  Then, she developed dizziness, "weird feeling in her face", acute "dull" headache, and right-sided leg weakness.  Patient states that she if left with tingling throughout her body, right sided leg weakness that is causing her to be off balance, acute dull generalized headache, dizziness, floaters in eyes.  All other symptoms have resolved.  Patient does have a history of fibromyalgia, trigeminal myalgia, migraines but reports that these current symptoms feel different than baseline symptoms.  She denies any recent falls or head injuries.     Past Medical History:  Diagnosis Date   Brain lesion    pt reported single lesion when worked up for possible MS   Breast mass 05/04/2014   left   Complication of anesthesia    states had a problem with her heart rate during UHR, and was hard to wake up post-op; could not be more specific, but was not referred to cardiologist   Fibromyalgia    Migraine    Mood disorder (HCC)    Obesity    Tachycardia    states heart rate has been high for last 3 doctor visits, has not been referred to cardiolgist or endocrinologist   Trigeminal neuralgia     Patient Active Problem List   Diagnosis Date Noted   Migraine with aura and without status  migrainosus, not intractable 08/03/2017   Left breast mass 05/15/2014    Past Surgical History:  Procedure Laterality Date   BREAST EXCISIONAL BIOPSY Left    2015   BREAST LUMPECTOMY WITH RADIOACTIVE SEED LOCALIZATION Left 05/15/2014   Procedure: LEFT BREAST LUMPECTOMY WITH RADIOACTIVE SEED LOCALIZATION;  Surgeon: Claud Kelp, MD;  Location: [facility];  Service: General;  Laterality: Left;   CHOLECYSTECTOMY  08/01/2010   DILATION AND CURETTAGE OF UTERUS  01/06/2002   ELBOW FRACTURE SURGERY Right    age 63   NASAL TURBINATE REDUCTION     TONSILLECTOMY  1989   UMBILICAL HERNIA REPAIR  2010   UMBILICAL HERNIA REPAIR  2010    OB History   No obstetric history on file.      Home Medications    Prior to Admission medications   Medication Sig Start Date End Date Taking? Authorizing Provider  acetaminophen (TYLENOL) 500 MG tablet Take 500 mg by mouth every 6 (six) hours as needed.    [provider]  ALPRAZolam Prudy Feeler) 0.5 MG tablet Take 0.5 mg by mouth at bedtime as needed.    [provider]  ARIPiprazole (ABILIFY) 10 MG tablet TAKE 1 TABLET BY MOUTH ONCE DAILY FOR 30 DAYS 10/05/19   [provider]  cholecalciferol (VITAMIN D3) 25 MCG (1000 UNIT) tablet Take 1,000 Units by mouth daily.    [provider]  DULoxetine (CYMBALTA) 60  MG capsule Take 120 mg by mouth daily. 05/25/15   [provider]  Erenumab-aooe (AIMOVIG) 140 MG/ML SOAJ Inject 140 mg into the skin every 28 (twenty-eight) days. 07/29/22   Antony Madura, MD  OXcarbazepine (TRILEPTAL) 150 MG tablet Take 150 mg by mouth 4 (four) times daily. 1 in am and 3 at night 02/27/21   [provider]  Rimegepant Sulfate (NURTEC) 75 MG TBDP Take 1 tablet (75 mg total) by mouth as needed (for first on set of migraine only one time within 24 hour). 08/12/22   Antony Madura, MD  rizatriptan (MAXALT-MLT) 10 MG disintegrating tablet Take 1 tablet (10 mg total) by mouth as  needed for migraine. May repeat in 2 hours if needed 06/13/18   Penumalli, Glenford Bayley, MD    Family History Family History  Problem Relation Age of Onset   Migraines Mother    Hypertension Mother    Hypertension Father    Hyperthyroidism Sister    Polycystic ovary syndrome Sister    Heart attack Maternal Grandfather    Heart attack Paternal Grandmother    Heart disease Paternal Grandfather    Migraines Son     Social History Social History   Tobacco Use   Smoking status: Never   Smokeless tobacco: Never  Vaping Use   Vaping status: Never Used  Substance Use Topics   Alcohol use: Yes    Comment: rarely   Drug use: No     Allergies   Adhesive [tape], Hydrocodone, Codeine, and Percocet [oxycodone-acetaminophen]   Review of Systems Review of Systems Per HPI  Physical Exam Triage Vital Signs ED Triage Vitals  Encounter Vitals Group     BP 11/19/22 1137 (!) 135/90     Systolic BP Percentile --      Diastolic BP Percentile --      Pulse Rate 11/19/22 1137 (!) 109     Resp 11/19/22 1137 15     Temp 11/19/22 1137 98.3 F (36.8 C)     Temp Source 11/19/22 1137 Oral     SpO2 11/19/22 1137 97 %     Weight --      Height --      Head Circumference --      Peak Flow --      Pain Score 11/19/22 1142 2     Pain Loc --      Pain Education --      Exclude from Growth Chart --    No data found.  Updated Vital Signs BP (!) 135/90 (BP Location: Right Arm)   Pulse (!) 109   Temp 98.3 F (36.8 C) (Oral)   Resp 15   LMP  (LMP Unknown) Comment: pt hs IUD  SpO2 97%   Visual Acuity Right Eye Distance:   Left Eye Distance:   Bilateral Distance:    Right Eye Near:   Left Eye Near:    Bilateral Near:     Physical Exam Constitutional:      General: She is not in acute distress.    Appearance: Normal appearance. She is not toxic-appearing or diaphoretic.  HENT:     Head: Normocephalic and atraumatic.  Eyes:     Extraocular Movements: Extraocular movements intact.      Conjunctiva/sclera: Conjunctivae normal.     Pupils: Pupils are equal, round, and reactive to light.  Cardiovascular:     Rate and Rhythm: Normal rate and regular rhythm.     Pulses: Normal pulses.  Heart sounds: Normal heart sounds.  Pulmonary:     Effort: Pulmonary effort is normal. No respiratory distress.     Breath sounds: Normal breath sounds.  Neurological:     General: No focal deficit present.     Mental Status: She is alert and oriented to person, place, and time. Mental status is at baseline.     Cranial Nerves: Cranial nerves 2-12 are intact.     Sensory: Sensation is intact.     Comments: Upper extremities appear normal with no weakness.  Patient has shaking motion with any movement of bilateral lower extremities and appears weak with any lifting or strength.  Patient is also having difficulty ambulating given right-sided leg weakness.  Psychiatric:        Mood and Affect: Mood normal.        Behavior: Behavior normal.        Thought Content: Thought content normal.        Judgment: Judgment normal.      UC Treatments / Results  Labs (all labs ordered are listed, but only abnormal results are displayed) Labs Reviewed - No data to display  EKG   Radiology No results found.  Procedures Procedures (including critical care time)  Medications Ordered in UC Medications - No data to display  Initial Impression / Assessment and Plan / UC Course  I have reviewed the triage vital signs and the nursing notes.  Pertinent labs & imaging results that were available during my care of the patient were reviewed by me and considered in my medical decision making (see chart for details).     Reports that her current symptoms are different than her baseline neurological symptoms.  Patient appears to have significant right-sided weakness which is concerning that patient needs a more extensive and adequate evaluation with possible imaging of the head which cannot be  provided here in urgent care.  Therefore, patient was advised to go to the ER today for further evaluation and management.  She was agreeable with this plan.  Suggested EMS transport but the patient declined this wishing for her husband who is here in urgent care to transport her.  Patient left with her husband transporting her to the ER. Final Clinical Impressions(s) / UC Diagnoses   Final diagnoses:  Paresthesias  Right sided weakness  Acute nonintractable headache, unspecified headache type  Diarrhea, unspecified type  Dizziness and giddiness     Discharge Instructions      Please go to the emergency department as soon as you leave urgent care for evaluation and management.    ED Prescriptions   None    PDMP not reviewed this encounter.   Gustavus Bryant, [place] 11/19/22 1230

## 2022-11-19 NOTE — Discharge Instructions (Signed)
Please go to the emergency department as soon as you leave urgent care for evaluation and management.

## 2022-11-19 NOTE — ED Notes (Signed)
Patient is being discharged from the Urgent Care and sent to the Emergency Department via private vehicle with spouse . Per Laren Everts, NP, patient is in need of higher level of care due to HA, parasthesias, balance problems. Patient is aware and verbalizes understanding of plan of care.  Vitals:   11/19/22 1137  BP: (!) 135/90  Pulse: (!) 109  Resp: 15  Temp: 98.3 F (36.8 C)  SpO2: 97%

## 2022-11-19 NOTE — ED Triage Notes (Addendum)
Pt c/o tingling, loss of balance, and headache. Pt states it started last night around 10:30 she started feeling sick, like she was going to have a bowel movement, then a pain in her stomach occurred she had sweats and couldn't breath, numbness and tingling starts in both arms and legs feeling like she's going to pass out then diarrhea onset, felt dizziness, and  her arms started to spasm then had tingling on her face as well. Shooting pain and stiffness in the right leg.

## 2022-11-19 NOTE — ED Triage Notes (Signed)
States all sxs from last night occurred @ approx 2230 last night. Spouse with pt.

## 2022-11-19 NOTE — ED Triage Notes (Addendum)
Pt c/o tingling in arms and legs bilat. Pt states last night had sharp pain in abdomen, felt hot and started blacking out, electric shock through arms bilat, legs bilat and left side of face and HA last night. PT states started having diarrhea and developed SOB and her hands bilat contracted and lasted 20 mins then went away, but still had the tingling. PT states she couldn't move her right leg and it last 15 mins. Pt states at that time husband massaged leg and she got a shooting pain up her right leg. Pt states she has had tingling since. Pt's LKW 2130 yesterday

## 2022-11-19 NOTE — ED Notes (Signed)
Pt gone to MRI 

## 2022-11-19 NOTE — ED Provider Notes (Signed)
Emergency Department Provider Note   I have reviewed the triage vital signs and the nursing notes.   HISTORY  Chief Complaint No chief complaint on file.   HPI VIOLANDA BOBECK is a 42 y.o. female with past history reviewed below presents emergency department with acute onset right leg weakness and balance issues. Symptoms began yesterday in the setting of lightheadedness and nausea.  She felt the need to have a bowel movement and went to the bathroom.  She had cramping abdominal pain and diarrhea.  She had a near syncope event with diaphoresis while on the toilet.  She states during that she had cramping in both of her upper extremities with inability to move her hands for around 30 seconds.  She states that her symptoms improved and then developed acute onset right leg weakness/numbness.  She called her husband and had to lift her leg.  This resolved fairly quickly but today has felt somewhat off balance with ambulation.  No unilateral weakness or numbness symptoms.  Her leg symptoms have not returned.  No headaches or vision changes.    Past Medical History:  Diagnosis Date   Brain lesion    pt reported single lesion when worked up for possible MS   Breast mass 05/04/2014   left   Complication of anesthesia    states had a problem with her heart rate during UHR, and was hard to wake up post-op; could not be more specific, but was not referred to cardiologist   Fibromyalgia    Migraine    Mood disorder (HCC)    Obesity    Tachycardia    states heart rate has been high for last 3 doctor visits, has not been referred to cardiolgist or endocrinologist   Trigeminal neuralgia     Review of Systems  Constitutional: No fever/chills Cardiovascular: Denies chest pain. Respiratory: Denies shortness of breath. Gastrointestinal: No abdominal pain. Positive nausea, no vomiting. Positive diarrhea.  No constipation. Genitourinary: Negative for dysuria. Musculoskeletal: Negative for  back pain. Skin: Negative for rash. Neurological: Negative for headaches. Positive right leg weakness/numbness.    ____________________________________________   PHYSICAL EXAM:  VITAL SIGNS: ED Triage Vitals  Encounter Vitals Group     BP 11/19/22 1710 96/76     Pulse Rate 11/19/22 1710 (!) 112     Resp 11/19/22 1710 14     Temp 11/19/22 1710 98.3 F (36.8 C)     Temp Source 11/19/22 1710 Oral     SpO2 11/19/22 1710 99 %     Weight 11/19/22 1713 231 lb 0.7 oz (104.8 kg)     Height 11/19/22 1713 5\' 3"  (1.6 m)   Constitutional: Alert and oriented. Well appearing and in no acute distress. Eyes: Conjunctivae are normal.  Head: Atraumatic. Nose: No congestion/rhinnorhea. Mouth/Throat: Mucous membranes are moist.   Neck: No stridor.   Cardiovascular: Normal rate, regular rhythm. Good peripheral circulation. Grossly normal heart sounds.   Respiratory: Normal respiratory effort.  No retractions. Lungs CTAB. Gastrointestinal: Soft and nontender. No distention.  Musculoskeletal: No lower extremity tenderness nor edema. No gross deformities of extremities. Neurologic:  Normal speech and language. No gross focal neurologic deficits are appreciated.  Skin:  Skin is warm, dry and intact. No rash noted.  ____________________________________________   LABS (all labs ordered are listed, but only abnormal results are displayed)  Labs Reviewed  COMPREHENSIVE METABOLIC PANEL - Abnormal; Notable for the following components:      Result Value   Glucose, Bld 108 (*)  Calcium 8.8 (*)    All other components within normal limits  I-STAT CHEM 8, ED - Abnormal; Notable for the following components:   Glucose, Bld 110 (*)    Calcium, Ion 1.11 (*)    TCO2 21 (*)    All other components within normal limits  PROTIME-INR  APTT  CBC  DIFFERENTIAL  ETHANOL  HCG, SERUM, QUALITATIVE  CBG MONITORING, ED   ____________________________________________  EKG   EKG  Interpretation Date/Time:  Thursday November 19 2022 17:27:58 EDT Ventricular Rate:  111 PR Interval:  124 QRS Duration:  86 QT Interval:  334 QTC Calculation: 454 R Axis:   49  Text Interpretation: Sinus tachycardia Cannot rule out Anterior infarct , age undetermined Abnormal ECG When compared with ECG of 07-Dec-2018 16:16, PREVIOUS ECG IS PRESENT Confirmed by Alona Bene [phone]) on 11/19/2022 10:59:21 PM        ____________________________________________  RADIOLOGY  CT HEAD WO CONTRAST  Result Date: 11/19/2022 CLINICAL DATA:  Initial evaluation for neuro deficit, stroke suspected. EXAM: CT HEAD WITHOUT CONTRAST TECHNIQUE: Contiguous axial images were obtained from the base of the skull through the vertex without intravenous contrast. RADIATION DOSE REDUCTION: This exam was performed according to the departmental dose-optimization program which includes automated exposure control, adjustment of the mA and/or kV according to patient size and/or use of iterative reconstruction technique. COMPARISON:  Prior study from 07/09/2021. FINDINGS: Brain: Cerebral volume within normal limits for patient age. No evidence for acute intracranial hemorrhage. No findings to suggest acute large vessel territory infarct. No mass lesion, midline shift, or mass effect. Ventricles are normal in size without evidence for hydrocephalus. No extra-axial fluid collection identified. Vascular: No hyperdense vessel identified. Skull: Scalp soft tissues demonstrate no acute abnormality. Calvarium intact. Sinuses/Orbits: Globes and orbital soft tissues within normal limits. Visualized paranasal sinuses are clear. No mastoid effusion. IMPRESSION: Normal head CT. No acute intracranial abnormality identified. Electronically Signed   By: Rise Mu M.D.   On: 11/19/2022 19:21    ____________________________________________   PROCEDURES  Procedure(s) performed:   Procedures  None   ____________________________________________   INITIAL IMPRESSION / ASSESSMENT AND PLAN / ED COURSE  Pertinent labs & imaging results that were available during my care of the patient were reviewed by me and considered in my medical decision making (see chart for details).   This patient is Presenting for Evaluation of balance issues, which does require a range of treatment options, and is a complaint that involves a high risk of morbidity and mortality.  The Differential Diagnoses include electrolyte disturbance, MS, CVA, spine lesion/compression, seizure, etc.  Critical Interventions-    Medications  sodium chloride flush (NS) 0.9 % injection 3 mL (3 mLs Intravenous Not Given 11/19/22 2106)  ALPRAZolam Prudy Feeler) tablet 0.5 mg (has no administration in time range)    Reassessment after intervention: symptoms improved.   I decided to review pertinent External Data, and in summary patient with no MS diagnosis. Abnormal brain MRI in the past and has followed with Neurology for similar presentations in the past.    Clinical Laboratory Tests Ordered, included CMP without AKI or electrolyte disturbance. EtOH negative. CBC negative.   Radiologic Tests Ordered, included CT head. I independently interpreted the images and agree with radiology interpretation.   Cardiac Monitor Tracing which shows NSR.    Social Determinants of Health Risk patient is a non-smoker.  Medical Decision Making: Summary:  Patient presents to the emergency department for evaluation of acute onset numbness/weakness to the right  leg yesterday in the setting of near syncope.  Seems vasovagal.  Has had neuro presentations in the past.  Plan for MRI and reassess. Care transferred to Dr. Clayborne Dana.   Patient's presentation is most consistent with acute presentation with potential threat to life or bodily function.   Disposition: pending  ____________________________________________  FINAL CLINICAL IMPRESSION(S) / ED  DIAGNOSES  Final diagnoses:  None   Note:  This document was prepared using Dragon voice recognition software and may include unintentional dictation errors.  Alona Bene, MD, [facility]

## 2022-11-19 NOTE — ED Provider Notes (Signed)
11:16 PM Assumed care from Dr. Jacqulyn Bath, please see their note for full history, physical and decision making until this point. In brief this is a 42 y.o. year old female who presented to the ED tonight with No chief complaint on file.     42 yo F w/ multiple neurologic complaints mostly from yesterday. Also had diarrhea, SOB, contractures. Now still with paresthesias. Now with no focal neuro complaints. Pending MRI's.   Discussed MRi results with radiologist, Dr. Grace Isaac, over the phone secondary to network issues he was not able to initially read them in canopy. Reportedly the two areas of remote CVA are stable, rest of brain/spine without acute changes. Patient relieved. Felt great with robaxin. Will rx same and she has fu w/ neuro.   Discharge instructions, including strict return precautions for new or worsening symptoms, given. Patient and/or family verbalized understanding and agreement with the plan as described.   Labs, studies and imaging reviewed by myself and considered in medical decision making if ordered. Imaging interpreted by radiology.  Labs Reviewed  COMPREHENSIVE METABOLIC PANEL - Abnormal; Notable for the following components:      Result Value   Glucose, Bld 108 (*)    Calcium 8.8 (*)    All other components within normal limits  I-STAT CHEM 8, ED - Abnormal; Notable for the following components:   Glucose, Bld 110 (*)    Calcium, Ion 1.11 (*)    TCO2 21 (*)    All other components within normal limits  PROTIME-INR  APTT  CBC  DIFFERENTIAL  ETHANOL  HCG, SERUM, QUALITATIVE  CBG MONITORING, ED    CT HEAD WO CONTRAST  Final Result    MR BRAIN WO CONTRAST    (Results Pending)  MR Cervical Spine Wo Contrast    (Results Pending)    No follow-ups on file.    Marily Memos, MD 11/20/22 2320

## 2022-11-20 MED ORDER — METHOCARBAMOL 500 MG PO TABS
500.0000 mg | ORAL_TABLET | Freq: Once | ORAL | Status: AC
  Administered 2022-11-20: 500 mg via ORAL

## 2022-11-20 MED ORDER — METHOCARBAMOL 500 MG PO TABS
ORAL_TABLET | ORAL | Status: AC
  Filled 2022-11-20: qty 1

## 2022-11-20 MED ORDER — ACETAMINOPHEN 500 MG PO TABS
ORAL_TABLET | ORAL | Status: AC
  Filled 2022-11-20: qty 2

## 2022-11-20 MED ORDER — METHOCARBAMOL 500 MG PO TABS: 500.0000 mg | ORAL_TABLET | Freq: Three times a day (TID) | ORAL | 0 refills | Status: DC | PRN

## 2022-11-20 MED ORDER — ACETAMINOPHEN 500 MG PO TABS
1000.0000 mg | ORAL_TABLET | Freq: Once | ORAL | Status: AC
  Administered 2022-11-20: 1000 mg via ORAL

## 2023-03-23 ENCOUNTER — Telehealth: Payer: Self-pay | Admitting: Neurology

## 2023-03-23 ENCOUNTER — Telehealth: Payer: Self-pay

## 2023-03-23 NOTE — Telephone Encounter (Signed)
Put in for a new PA

## 2023-03-23 NOTE — Telephone Encounter (Signed)
Pt needs a refill on her Aimovig 140MG /ML  Pharmacy- [facility] on [address] [employer]

## 2023-03-24 NOTE — Telephone Encounter (Signed)
Called and left message that a new PA was done for Aimovig.

## 2023-04-07 NOTE — Progress Notes (Unsigned)
NEUROLOGY FOLLOW UP OFFICE NOTE  Debra Hensley [id number]  Subjective:  Debra Hensley is a 42 y.o. year old right-handed female with a medical history of migraine with aura, fibromyalgia, anxiety, depression who we last saw on 07/29/22.  To briefly review: Patient has a long history of migraines since childhood and a strong family history of migraines. About 5 years ago, (?2017) she started having mostly left face pain (very rare on right). It feels like she has pain from left ear to jaw to nose then eye. It can be triggered by crying, brushing her teeth, chewing the wrong way, or stress. She describes a sharp stabbing pain that becomes constant for up to 3 days (does not quickly come and go). She has phonophobia, but not clear photophobia. She denies nausea or vomiting. It will hurt for her to talk. She has to stay in bed.   Rizatriptan can help. It can make symptoms go away in about 30 minutes, but symptoms return within a few hours. She will take 9-10 rizatriptans in a 3 day period when her symptoms get bad. She will also take tylenol, 600 mg every 4-6 hours during her 3 day headaches.   For prevention, she is currently on Cymbalta 180 mg qhs (prescribed 120 mg) and trileptal 450 mg qhs (prescribed 150 mg in am and 450 mg qhs). She has tried many previous medications, see below. She has not previously tried botox, CGRP inhibitors, or gepants.    She will get a 3 day pain episode once per week or once every other week (2-4 episodes per month).   Patient has previously been seen by multiple neurologists. In 2017, Dr. Marjory Lies at [facility] mentioned migraines with aura since the age of 81. Notes first mention left face pain associated with pain in eye, neck, and shoulder as well in 2019. MRI on 02/13/18 showed small right thalamic gliosis. Patient was diagnosed with thalamic pain syndrome.   Patient was then seen virtually at [facility] in 2021. Their impression was that patient had an atypical form of  trigeminal neuralgia as there was jabbing and stabbing pain in left V1-V3 distribution that constantly repeats and can last 3-4 days.   Patient was seen at [facility] in 2022 for essential tremor. Per 10/23/2021 note from Dr. Dina Rich: "Exam had findings of mild finger myoclonus and I suspect her jerking during sleep is nocturnal myoclonus. She also had a tremor of variable frequency with distractibility and entrainment suggestive of a functional component. We discontinued clonazepam as she had daytime sleepiness with no benefit to myoclonus. Since her last visit, the patient initiated Keppra 250 mg bid without improvement in myoclonus and with increased eczema flare-ups. For various reasons she self-discontinued Keppra and all other medications - with the exceptions of Trileptal and Cymbalta - with subsequent worsening of her trigeminal neuralgia which, when poorly controlled, is significantly more bothersome than her abnormal movements. She was seen by Dr. Vonna Kotyk for her headaches around 2 weeks ago who recommended successive titration of Trileptal, verapamil, and tizanidine. Given that she is in the midst of adjusting several medications for headaches, we will defer trialing any medications for myoclonus at this time. We discussed Depakote for future consideration. I have counseled on CBT-based therapy for the functional component."   Patient was last seen by Dr. Langston Masker at [facility] on 04/14/22. Per that clinic note: Patient states that about 3 and half years ago, she started having episodes of right-sided facial pain that would originate behind the  right ear and radiated up into the right orbit and cheek area. Pain would be so severe that she described it as a hot poker and sharp in nature. Typically these pain sensations would last about 3 days before they would resolve. The only thing that would alleviate the pain even remotely would be taking multiple doses of rizatriptan (more than prescribed). Triggers would include  stress, being tired and brushing her teeth. Has undergone imaging studies that did not show any acute abnormalities that would be contributing to the pain. Initially was seen neurology who thought this was more of a migraine variant, but then later she was seen at [facility] where she was told this was more related to trigeminal neuralgia. Has not noted any clear of facial flushing, tearing or rhinitis when these occur. Sometimes feels like she will be drooling when the pain is at its worst. Also has been noticing other issues where she will have some tremors at times, losing her balance issues with walking and getting her words out. Dors is some tingling of both feet and hands, but more than anything, the pain in the face seems to be the most prominent issue for her today. Has tried numerous medications in the past and stated that a lot of these were discontinued on her own secondary to side effects can try and remain functional. At present, taking oxcarbazepine 450 mg at night, but was not able to tolerate this as much during the day secondary to sedation.  Previous medications tried Trileptal Cymbalta Gabapentin Lyrica Indomethacin Rizatriptan Sumatriptan Keppra Verapamil Sumatriptan hand Naproxen Clonazepam Carbamazepine Lamictal Topamax    Assessment  42 y.o. female with PMHx as above who presents for evaluation of trigeminal neuralgia. Patient's neurologic exam is nonfocal and nonlateralizing, which is reassuring. At this point, it is unclear how much of this is actually related to her trigeminal neuralgia versus an atypical headache disorder, such as hemicrania continua. Generally with trigeminal neuralgia at the symptoms are more persistent. And, with a headache disorders, they still can involve the trigeminal nerve. She is already tried multiple medications and had several different side effects. At this point, I do not think there is anything else additional that I would be able to offer that  has not already been tried. May benefit from seeing a dedicated headache provider or pain specialist. Injections might be another option.  Plan   Trigeminal neuralgia versus atypical headache disorder Already tried and failed numerous different medications May continue with the Cymbalta and Trileptal Referral to headache specialist Could also consider referral to pain specialist    Patient has a family, does not wish to have more children, and currently has an IUD in place.   She does not drink caffeine. She drinks EtOH only rarely. She does not smoke.   Patient also mentions various other symptoms: -She shakes all over all the time. -She has difficulty getting her words out. No clear pattern. -Memory difficulties -Scalp hurts to touch -Neck pain -Difficulty controlling bladder (no difficulty with bowels) -Tingling in arms and legs, no pattern or triggers, but comes and goes frequently - wonders if it is medication related  Most recent Assessment and Plan (07/29/22): Debra Hensley is a 42 y.o. female who presents for evaluation of left facial pain and headaches. She has a relevant medical history of migraine with aura, fibromyalgia, anxiety, depression. Her neurological examination is essentially normal today. Available diagnostic data is significant for MRI brain from 2023 that shows remote lesions in right  basal ganglia/internal capsule/thalamus. Patient's symptoms are likely multifactorial. While there has been suspicion of trigeminal neuralgia in the past, patient's symptoms last too long to be considered typical for TN. Hemicrania continua is a possibility, but maybe less likely. With symptoms lasting ~3 days, occurring multiple times per month, and being somewhat responsive to rizatriptan, symptoms seem most consistent with migraine with aura, which she has a long standing history of and a strong family history. Her symptoms are likely complicated by medication overuse as well.  Thalamic pain syndrome may be contributing, as suspected in the past. Fibromyalgia is also likely contributing.   Patient has tried multiple previous migraine preventative medications and abortive therapies and either had lack of efficacy or side effects. These include: Gabapentin Lyrica Keppra Verapamil Clonazepam Carbamazepine Lamictal Topamax    Indomethacin Rizatriptan Sumatriptan Naproxen Tylenol   She is currently having ~ 4 headaches a month for 3 days each (12 headache days per month) that are very debilitating. As a result, she would benefit for CGRP inhibitor and gepant.  PLAN: -Blood work: B12, ESR -MRI trigeminal nerve protocol   -Would only recommend Cymbalta 120 mg (not 180 mg) -Continue Trileptal 450 mg qhs -Continue Rizatriptan 10 mg PRN until Nurtec is approved -Limit use of pain relievers to no more than 2 days out of week to prevent risk of rebound or medication-overuse headache. -Keep headache diary   Will start getting the following approved: -Aimovig 140 mg injection every 28 days -Nurtec 75 mg PRN  Since their last visit: Nurtec and Aimovig were approved (08/10/22 and 08/07/22 respectively). The Aimovig refills were denied, so patient has not had since 02/2023. PA is currently pending. When she was taking Aimovig, she would have 2 headaches per month (previously 1 per week). Since the Aimovig has not been refilled, she has more headaches. She is also going through divorce, so this is also contributing. She continues to take Nurtec, it will help for a while.  She is now having imbalance, vertigo when changing positions, and dropping things in her hands for the last 6 months. These seem unrelated to headaches. It is worse with stress. She can have tingling   ESR was normal. B12 was borderline low, for which I recommended B12 1000 mcg daily. She has not started this.  She presented to ED on 11/19/22 for paresthesias. MRI brain was stable since 2020 (two small  remote insults in right cerebrum). MRI cervical, thoracic, and lumbar spine were unremarkable.  Patient stopped trileptal 450 mg at bedtime. The face pain did not change when she stopped  Current medications: -Cymbalta 120 mg qhs -Aimovig q28 days - not taken since 02/2023 - new PA needed -Nurtec PRN  MEDICATIONS:  Outpatient Encounter Medications as of 04/08/2023  Medication Sig   acetaminophen (TYLENOL) 500 MG tablet Take 500 mg by mouth every 6 (six) hours as needed.   cholecalciferol (VITAMIN D3) 25 MCG (1000 UNIT) tablet Take 1,000 Units by mouth daily.   DULoxetine (CYMBALTA) 60 MG capsule Take 120 mg by mouth daily.   methylPREDNISolone (MEDROL DOSEPAK) 4 MG TBPK tablet Take 6 tablets for 1 day, then 5 tablets for 1 day, then 4 tablets for 1 day, then 3 tablets for 1 day, then 2 tablets for 1 day, then 1 tablet for 1 day, then STOP   Rimegepant Sulfate (NURTEC) 75 MG TBDP Take 1 tablet (75 mg total) by mouth as needed (for first on set of migraine only one time within 24 hour).  Erenumab-aooe (AIMOVIG) 140 MG/ML SOAJ Inject 140 mg into the skin every 28 (twenty-eight) days. (Patient not taking: Reported on 04/08/2023)   methocarbamol (ROBAXIN) 500 MG tablet Take 1 tablet (500 mg total) by mouth every 8 (eight) hours as needed for muscle spasms.   [DISCONTINUED] ALPRAZolam (XANAX) 0.5 MG tablet Take 0.5 mg by mouth at bedtime as needed.   [DISCONTINUED] ARIPiprazole (ABILIFY) 10 MG tablet TAKE 1 TABLET BY MOUTH ONCE DAILY FOR 30 DAYS   [DISCONTINUED] methocarbamol (ROBAXIN) 500 MG tablet Take 1 tablet (500 mg total) by mouth every 8 (eight) hours as needed for muscle spasms. (Patient not taking: Reported on 04/08/2023)   [DISCONTINUED] OXcarbazepine (TRILEPTAL) 150 MG tablet Take 150 mg by mouth 4 (four) times daily. 1 in am and 3 at night   [DISCONTINUED] rizatriptan (MAXALT-MLT) 10 MG disintegrating tablet Take 1 tablet (10 mg total) by mouth as needed for migraine. May repeat in 2 hours  if needed   No facility-administered encounter medications on file as of 04/08/2023.    PAST MEDICAL HISTORY: Past Medical History:  Diagnosis Date   Brain lesion    pt reported single lesion when worked up for possible MS   Breast mass 05/04/2014   left   Complication of anesthesia    states had a problem with her heart rate during UHR, and was hard to wake up post-op; could not be more specific, but was not referred to cardiologist   Fibromyalgia    Migraine    Mood disorder (HCC)    Obesity    Tachycardia    states heart rate has been high for last 3 doctor visits, has not been referred to cardiolgist or endocrinologist   Trigeminal neuralgia     PAST SURGICAL HISTORY: Past Surgical History:  Procedure Laterality Date   BREAST EXCISIONAL BIOPSY Left    2015   BREAST LUMPECTOMY WITH RADIOACTIVE SEED LOCALIZATION Left 05/15/2014   Procedure: LEFT BREAST LUMPECTOMY WITH RADIOACTIVE SEED LOCALIZATION;  Surgeon: Claud Kelp, MD;  Location: [facility];  Service: General;  Laterality: Left;   CHOLECYSTECTOMY  08/01/2010   DILATION AND CURETTAGE OF UTERUS  01/06/2002   ELBOW FRACTURE SURGERY Right    age 80   NASAL TURBINATE REDUCTION     TONSILLECTOMY  1989   UMBILICAL HERNIA REPAIR  2010   UMBILICAL HERNIA REPAIR  2010    ALLERGIES: Allergies  Allergen Reactions   Adhesive [Tape] Other (See Comments)    BLISTERS   Hydrocodone Itching   Codeine Itching   Percocet [Oxycodone-Acetaminophen] Itching    FAMILY HISTORY: Family History  Problem Relation Age of Onset   Migraines Mother    Hypertension Mother    Hypertension Father    Hyperthyroidism Sister    Polycystic ovary syndrome Sister    Heart attack Maternal Grandfather    Heart attack Paternal Grandmother    Heart disease Paternal Grandfather    Migraines Son     SOCIAL HISTORY: Social History   Tobacco Use   Smoking status: Never   Smokeless tobacco: Never  Vaping Use   Vaping status:  Never Used  Substance Use Topics   Alcohol use: Yes    Comment: rarely   Drug use: No   Social History   Social History Narrative   Lives at home with husband and 3 kids   Education -    Caffeine use: 1 cup daily   Are you right handed or left handed? right   Are you  currently employed ? yes   What is your current occupation?   Do you live at home alone? no   Who lives with you? Husband and children   What type of home do you live in: 1 story or 2 story? one          Objective:  Vital Signs:  BP 124/67   Pulse 97   Ht 5\' 3"  (1.6 m)   Wt 224 lb 12.8 oz (102 kg)   SpO2 97%   BMI 39.82 kg/m   General: No acute distress.  Patient appears well-groomed.   Head:  Normocephalic/atraumatic Eyes:  fundi examined, disc margins clear, no obvious papilledema Neck: supple, Positive for paraspinal tenderness, reduced range of motion Lungs: Non-labored breathing on room air   Neurological Exam: Mental status: alert and oriented, speech fluent and not dysarthric, language intact.  Cranial nerves: CN I: not tested CN II: pupils equal, round and reactive to light, visual fields intact CN III, IV, VI:  full range of motion, no nystagmus, no ptosis CN V: facial sensation intact. CN VII: upper and lower face symmetric CN VIII: hearing intact CN IX, X: uvula midline CN XI: sternocleidomastoid and trapezius muscles intact CN XII: tongue midline  Bulk & Tone: normal, no fasciculations. Motor:  muscle strength 5/5 throughout Deep Tendon Reflexes:  2+ throughout.   Sensation:  Pinprick and vibratory sensation intact. Finger to nose testing:  Without dysmetria.   Gait:  Normal station and stride.  Romberg negative.   Labs and Imaging review: New results: 11/19/22: CMP unremarkable CBC unremarkable  07/29/22: ESR wnl B12: 341  MRI brain wo contrast (11/19/22): FINDINGS: Brain: Small remote insult at the genu of the internal capsule and posterior putamen on the right. These  remain nonspecific and isolated. No generalized white matter disease, acute infarct, hemorrhage, hydrocephalus, or mass. Brain volume is normal   Vascular: Normal flow voids.   Skull and upper cervical spine: Normal marrow signal   Sinuses/Orbits: Negative   IMPRESSION: Two small remote insults in the deep right cerebrum are stable since at least 2020 and remain nonspecific. No acute or progressive finding.  MRI cervical, thoracic, and lumbar spine wo contrast (11/19/22): FINDINGS: MRI CERVICAL SPINE FINDINGS   Alignment: Physiologic.   Vertebrae: No fracture, evidence of discitis, or bone lesion.   Cord: Normal signal and morphology.   Posterior Fossa, vertebral arteries, paraspinal tissues: Negative.   Disc levels:   No herniation, degenerative spurring, or impingement.   MRI THORACIC SPINE FINDINGS   Alignment:  No listhesis   Vertebrae: No fracture, evidence of discitis, or bone lesion.   Cord:  Normal signal and morphology.   Paraspinal and other soft tissues: No perispinal mass or inflammation. Root sleeve cysts most notably at the left T1-2 and T2 foramina.   Disc levels:   Small disc protrusions at T4-5 to T8-9.  No neural impingement.   MRI LUMBAR SPINE FINDINGS   Segmentation:  Standard.   Alignment:  Physiologic.   Vertebrae:  No fracture, evidence of discitis, or bone lesion.   Conus medullaris and cauda equina: Conus extends to the L1 level. Conus and cauda equina appear normal.   Paraspinal and other soft tissues: Negative for perispinal mass or inflammation   Disc levels:   Small left foraminal protrusion at L4-5.   IMPRESSION: Normal MRI of the cord.   Small midthoracic disc herniations. No neural impingement throughout the spine.  Previously reviewed results: Lab Results  Component Value Date  HGBA1C 5.5 07/05/2018      Recent Labs       Lab Results  Component Value Date    VITAMINB12 486 07/05/2018      Recent Labs        Lab Results  Component Value Date    TSH 0.612 07/05/2018      Recent Labs[] Expand by Default  No results found for: "ESRSEDRATE", "POCTSEDRATE"     External labs: TSH - wnl   Imaging:  MRI Brain 09/08/2021 Small T2 FLAIR hyperintense remote insults within the genu of right  internal capsule and posterior right lentiform nucleus (measuring up  to 5 mm), unchanged from the prior brain MRI of 09/02/2019. Although  nonspecific, these could reflect small chronic infarcts.  Otherwise unremarkable MRI appearance of the brain.  Small mucous retention cysts within the right maxillary sinus.   MRI Lumbar 01/28/2020 Normal MRI of the lumbar spine. No findings to explain patient's  symptoms identified.   MRI Brain 09/02/2019 Single 3 mm T2/flair hyperintense focus in the right anterior lateral  thalamus, unchanged in appearance compared to the 2019 MRI. This could represent a small focus of gliosis from chronic ischemic change or other etiology.  There is a single enhancement pattern and no acute findings.   CTA Head and Neck 12/07/2018 Normal CT head  Normal CTA head and neck. Negative for stenosis or dissection. Negative for cerebral aneurysm.   MRI Brain 07/05/2018 Unremarkable MRI brain (with and without). No acute findings.  Stable small (3mm) focus of right thalamic gliosis. No change  from MRI on 02/13/18.   MRI C-spine 02/13/2018 Unremarkable MRI scan of cervical spine with and without  contrast    Assessment/Plan:  This is Debra Hensley, a 42 y.o. female with: 1. Migraine with aura - improved when she had Aimovig and Nurtec helps for rescue. Patient has tried multiple previous migraine preventative medications and abortive therapies and either had lack of efficacy or side effects. These include: Gabapentin Lyrica Keppra Verapamil Clonazepam Carbamazepine Lamictal Topamax    Indomethacin Rizatriptan Sumatriptan Naproxen Tylenol  2. Left face pain  previously felt to be trigeminal neuralgia. She was on Trileptal but stopped it since last clinic visit. This did not change her symptoms and they were improving with Aimovig, so TN is unclear.  3. Imbalance, paresthesias - patient's exam is normal. B12 was borderline low and she did not start taking supplement as recommended. This could explain these symptoms. Her MRI neuroaxis did not show any clear etiology.  4. Fibromyalgia - on Cymbalta 120 mg at bedtime  Plan: -B12 1000 mcg daily -Continue vit D supplement -Medrol dose pack to break headache cycle -Robaxin refill for neck tightness -Physical therapy for neck pain For migraines: Migraine prevention:  Aimovig 140 mg - needs new PA, will look into this today. Sample of Aimovig given. Migraine rescue:  Nurtec 75 mg as needed Limit use of pain relievers to no more than 2 days out of week to prevent risk of rebound or medication-overuse headache. Keep headache diary  Return to clinic in 3 months  Total time spent reviewing records, interview, history/exam, documentation, and coordination of care on day of encounter:  30 min  Jacquelyne Balint, MD

## 2023-04-08 ENCOUNTER — Telehealth: Payer: Self-pay

## 2023-04-08 ENCOUNTER — Encounter: Payer: Self-pay | Admitting: Neurology

## 2023-04-08 ENCOUNTER — Ambulatory Visit: Payer: 59 | Admitting: Neurology

## 2023-04-08 VITALS — BP 124/67 | HR 97 | Ht 63.0 in | Wt 224.8 lb

## 2023-04-08 DIAGNOSIS — R519 Headache, unspecified: Secondary | ICD-10-CM

## 2023-04-08 DIAGNOSIS — M542 Cervicalgia: Secondary | ICD-10-CM

## 2023-04-08 DIAGNOSIS — E559 Vitamin D deficiency, unspecified: Secondary | ICD-10-CM

## 2023-04-08 DIAGNOSIS — G43109 Migraine with aura, not intractable, without status migrainosus: Secondary | ICD-10-CM | POA: Diagnosis not present

## 2023-04-08 DIAGNOSIS — M797 Fibromyalgia: Secondary | ICD-10-CM | POA: Diagnosis not present

## 2023-04-08 DIAGNOSIS — E538 Deficiency of other specified B group vitamins: Secondary | ICD-10-CM

## 2023-04-08 MED ORDER — METHOCARBAMOL 500 MG PO TABS
500.0000 mg | ORAL_TABLET | Freq: Three times a day (TID) | ORAL | 0 refills | Status: AC | PRN
Start: 2023-04-08 — End: ?

## 2023-04-08 MED ORDER — METHYLPREDNISOLONE 4 MG PO TBPK
ORAL_TABLET | ORAL | 0 refills | Status: DC
Start: 2023-04-08 — End: 2024-02-17

## 2023-04-08 NOTE — Telephone Encounter (Signed)
Medication Samples have been provided to the patient.  Drug name: aimovig       Strength: 140mg         Qty: 1  LOT: [id number]  Exp.Date: 12/02/2023  Dosing instructions:  Inject 140 mg into the skin every 28 (twenty-eight) days.,   The patient has been instructed regarding the correct time, dose, and frequency of taking this medication, including desired effects and most common side effects.   Lenise Herald 11:48 AM 04/08/2023

## 2023-04-08 NOTE — Patient Instructions (Addendum)
Your B12 was borderline low. Given your symptoms, I would recommend supplementing with B12 1000 mcg daily. This can be bought over the counter at any local drug store or online.   For headaches: -Medrol dose pack to break headache cycle -Physical therapy for neck pain Migraine prevention:  Aimovig 140 mg - I will look into this today Migraine rescue:  Nurtec 75 mg as needed Limit use of pain relievers to no more than 2 days out of week to prevent risk of rebound or medication-overuse headache. Keep headache diary  Follow up in 3 months. Please let me know if you have any questions or concerns in the meantime.   The physicians and staff at [facility] are committed to providing excellent care. You may receive a survey requesting feedback about your experience at our office. We strive to receive "very good" responses to the survey questions. If you feel that your experience would prevent you from giving the office a "very good " response, please contact our office to try to remedy the situation. We may be reached at [phone]. Thank you for taking the time out of your busy day to complete the survey.  Jacquelyne Balint, MD [facility]

## 2023-04-16 ENCOUNTER — Other Ambulatory Visit (HOSPITAL_COMMUNITY): Payer: Self-pay

## 2023-04-16 ENCOUNTER — Telehealth: Payer: Self-pay | Admitting: Pharmacy Technician

## 2023-04-16 ENCOUNTER — Telehealth: Payer: Self-pay

## 2023-04-16 NOTE — Telephone Encounter (Signed)
Called and left message that Aimovig has been approved. 04/16/23

## 2023-04-16 NOTE — Telephone Encounter (Signed)
Pharmacy Patient Advocate Encounter   Received notification from CoverMyMeds that prior authorization for AIMOVIG 140MG  is required/requested.   Insurance verification completed.   The patient is insured through [facility] .   Per test claim: PA required; PA submitted to above mentioned insurance via CoverMyMeds Key/confirmation #/EOC [id number] Status is pending

## 2023-04-16 NOTE — Telephone Encounter (Signed)
Pharmacy Patient Advocate Encounter  Received notification from [facility] that Prior Authorization for AIMOVIG 140MG  has been APPROVED from 12.13.24 to 12.13.25. Ran test claim, Copay is $35. This test claim was processed through [facility]- copay amounts may vary at other pharmacies due to pharmacy/plan contracts, or as the patient moves through the different stages of their insurance plan.   PA #/Case ID/Reference #: [id number]

## 2023-05-20 NOTE — Telephone Encounter (Signed)
 Marland Kitchen

## 2023-07-06 ENCOUNTER — Other Ambulatory Visit: Payer: Self-pay | Admitting: Neurology

## 2023-07-06 DIAGNOSIS — G43109 Migraine with aura, not intractable, without status migrainosus: Secondary | ICD-10-CM

## 2023-07-23 ENCOUNTER — Ambulatory Visit: Payer: 59 | Admitting: Neurology

## 2023-08-09 NOTE — Progress Notes (Signed)
 NEUROLOGY FOLLOW UP OFFICE NOTE  EMERI ESTILL [id number]  Subjective:  Debra Hensley is a 43 y.o. year old right-handed female with a medical history of migraine with aura, fibromyalgia, anxiety, depression who we last saw on 04/08/23.  To briefly review: 07/29/22: Patient has a long history of migraines since childhood and a strong family history of migraines. About 5 years ago, (?2017) she started having mostly left face pain (very rare on right). It feels like she has pain from left ear to jaw to nose then eye. It can be triggered by crying, brushing her teeth, chewing the wrong way, or stress. She describes a sharp stabbing pain that becomes constant for up to 3 days (does not quickly come and go). She has phonophobia, but not clear photophobia. She denies nausea or vomiting. It will hurt for her to talk. She has to stay in bed.   Rizatriptan can help. It can make symptoms go away in about 30 minutes, but symptoms return within a few hours. She will take 9-10 rizatriptans in a 3 day period when her symptoms get bad. She will also take tylenol, 600 mg every 4-6 hours during her 3 day headaches.   For prevention, she is currently on Cymbalta 180 mg qhs (prescribed 120 mg) and trileptal 450 mg qhs (prescribed 150 mg in am and 450 mg qhs). She has tried many previous medications, see below. She has not previously tried botox, CGRP inhibitors, or gepants.    She will get a 3 day pain episode once per week or once every other week (2-4 episodes per month).   Patient has previously been seen by multiple neurologists. In 2017, Dr. Salli Crawley at [facility] mentioned migraines with aura since the age of 22. Notes first mention left face pain associated with pain in eye, neck, and shoulder as well in 2019. MRI on 02/13/18 showed small right thalamic gliosis. Patient was diagnosed with thalamic pain syndrome.   Patient was then seen virtually at [facility] in 2021. Their impression was that patient had an  atypical form of trigeminal neuralgia as there was jabbing and stabbing pain in left V1-V3 distribution that constantly repeats and can last 3-4 days.   Patient was seen at [facility] in 2022 for essential tremor. Per 10/23/2021 note from Dr. Curley Double: "Exam had findings of mild finger myoclonus and I suspect her jerking during sleep is nocturnal myoclonus. She also had a tremor of variable frequency with distractibility and entrainment suggestive of a functional component. We discontinued clonazepam as she had daytime sleepiness with no benefit to myoclonus. Since her last visit, the patient initiated Keppra 250 mg bid without improvement in myoclonus and with increased eczema flare-ups. For various reasons she self-discontinued Keppra and all other medications - with the exceptions of Trileptal and Cymbalta - with subsequent worsening of her trigeminal neuralgia which, when poorly controlled, is significantly more bothersome than her abnormal movements. She was seen by Dr. Marijean Shouts for her headaches around 2 weeks ago who recommended successive titration of Trileptal, verapamil, and tizanidine. Given that she is in the midst of adjusting several medications for headaches, we will defer trialing any medications for myoclonus at this time. We discussed Depakote for future consideration. I have counseled on CBT-based therapy for the functional component."   Patient was last seen by Dr. Cipriano Creeks at [place] on 04/14/22. Per that clinic note: Patient states that about 3 and half years ago, she started having episodes of right-sided facial pain that would originate behind  the right ear and radiated up into the right orbit and cheek area. Pain would be so severe that she described it as a hot poker and sharp in nature. Typically these pain sensations would last about 3 days before they would resolve. The only thing that would alleviate the pain even remotely would be taking multiple doses of rizatriptan (more than prescribed).  Triggers would include stress, being tired and brushing her teeth. Has undergone imaging studies that did not show any acute abnormalities that would be contributing to the pain. Initially was seen neurology who thought this was more of a migraine variant, but then later she was seen at [facility] where she was told this was more related to trigeminal neuralgia. Has not noted any clear of facial flushing, tearing or rhinitis when these occur. Sometimes feels like she will be drooling when the pain is at its worst. Also has been noticing other issues where she will have some tremors at times, losing her balance issues with walking and getting her words out. Dors is some tingling of both feet and hands, but more than anything, the pain in the face seems to be the most prominent issue for her today. Has tried numerous medications in the past and stated that a lot of these were discontinued on her own secondary to side effects can try and remain functional. At present, taking oxcarbazepine 450 mg at night, but was not able to tolerate this as much during the day secondary to sedation.  Previous medications tried Trileptal Cymbalta Gabapentin Lyrica Indomethacin Rizatriptan Sumatriptan Keppra Verapamil Sumatriptan hand Naproxen Clonazepam Carbamazepine Lamictal Topamax    Assessment  42 y.o. female with PMHx as above who presents for evaluation of trigeminal neuralgia. Patient's neurologic exam is nonfocal and nonlateralizing, which is reassuring. At this point, it is unclear how much of this is actually related to her trigeminal neuralgia versus an atypical headache disorder, such as hemicrania continua. Generally with trigeminal neuralgia at the symptoms are more persistent. And, with a headache disorders, they still can involve the trigeminal nerve. She is already tried multiple medications and had several different side effects. At this point, I do not think there is anything else additional that I would  be able to offer that has not already been tried. May benefit from seeing a dedicated headache provider or pain specialist. Injections might be another option.  Plan   Trigeminal neuralgia versus atypical headache disorder Already tried and failed numerous different medications May continue with the Cymbalta and Trileptal Referral to headache specialist Could also consider referral to pain specialist    Patient has a family, does not wish to have more children, and currently has an IUD in place.   She does not drink caffeine. She drinks EtOH only rarely. She does not smoke.   Patient also mentions various other symptoms: -She shakes all over all the time. -She has difficulty getting her words out. No clear pattern. -Memory difficulties -Scalp hurts to touch -Neck pain -Difficulty controlling bladder (no difficulty with bowels) -Tingling in arms and legs, no pattern or triggers, but comes and goes frequently - wonders if it is medication related  I ordered Aimovig and Nurtec for patient on 07/29/22.  04/08/23: Nurtec and Aimovig were approved (08/10/22 and 08/07/22 respectively). The Aimovig refills were denied, so patient has not had since 02/2023. PA is currently pending. When she was taking Aimovig, she would have 2 headaches per month (previously 1 per week). Since the Aimovig has not been refilled,  she has more headaches. She is also going through divorce, so this is also contributing. She continues to take Nurtec, it will help for a while.   She is now having imbalance, vertigo when changing positions, and dropping things in her hands for the last 6 months. These seem unrelated to headaches. It is worse with stress. She can have tingling    ESR was normal. B12 was borderline low, for which I recommended B12 1000 mcg daily. She has not started this.   She presented to ED on 11/19/22 for paresthesias. MRI brain was stable since 2020 (two small remote insults in right cerebrum). MRI  cervical, thoracic, and lumbar spine were unremarkable.   Patient stopped trileptal 450 mg at bedtime. The face pain did not change when she stopped   Current medications: -Cymbalta 120 mg qhs -Aimovig q28 days - not taken since 02/2023 - new PA needed -Nurtec PRN  Most recent Assessment and Plan (04/08/23): This is Debra Hensley, a 43 y.o. female with: 1. Migraine with aura - improved when she had Aimovig and Nurtec helps for rescue. Patient has tried multiple previous migraine preventative medications and abortive therapies and either had lack of efficacy or side effects. These include: Gabapentin Lyrica Keppra Verapamil Clonazepam Carbamazepine Lamictal Topamax    Indomethacin Rizatriptan Sumatriptan Naproxen Tylenol   2. Left face pain previously felt to be trigeminal neuralgia. She was on Trileptal but stopped it since last clinic visit. This did not change her symptoms and they were improving with Aimovig, so TN is unclear.   3. Imbalance, paresthesias - patient's exam is normal. B12 was borderline low and she did not start taking supplement as recommended. This could explain these symptoms. Her MRI neuroaxis did not show any clear etiology.   4. Fibromyalgia - on Cymbalta 120 mg at bedtime   Plan: -B12 1000 mcg daily -Continue vit D supplement -Medrol dose pack to break headache cycle -Robaxin refill for neck tightness -Physical therapy for neck pain For migraines: Migraine prevention:  Aimovig 140 mg - needs new PA, will look into this today. Sample of Aimovig given. Migraine rescue:  Nurtec 75 mg as needed Limit use of pain relievers to no more than 2 days out of week to prevent risk of rebound or medication-overuse headache. Keep headache diary  Since their last visit: Aimovig was approved through 04/15/24 by insurance on 04/16/23. She is having maybe 1 headache per month. When she does get a headache, it is not as severe. Nurtec helps the headache within 30  minutes.   Patient still gets an occasional zapping left facial pain but this has also improved some with Aimovig.  Patient is taking B12 and vit D. She is still on Cymbalta for fibromyalgia.   MEDICATIONS:  Outpatient Encounter Medications as of 08/18/2023  Medication Sig   acetaminophen (TYLENOL) 500 MG tablet Take 500 mg by mouth every 6 (six) hours as needed.   AIMOVIG 140 MG/ML SOAJ INJECT 140 MG INTO THE SKIN EVERY 28 DAYS   brexpiprazole (REXULTI) 1 MG TABS tablet Take 0.25 mg by mouth daily.   cholecalciferol (VITAMIN D3) 25 MCG (1000 UNIT) tablet Take 1,000 Units by mouth daily.   DULoxetine (CYMBALTA) 60 MG capsule Take 120 mg by mouth daily.   NURTEC 75 MG TBDP DISSOLVE 1 TABLET BY MOUTH AS NEEDED (FOR FIRST ON SET OF MIGRAINE ONLY ONE TIME WITHIN 24 HOURS)   methocarbamol (ROBAXIN) 500 MG tablet Take 1 tablet (500 mg total) by mouth  every 8 (eight) hours as needed for muscle spasms. (Patient not taking: Reported on 08/18/2023)   methylPREDNISolone (MEDROL DOSEPAK) 4 MG TBPK tablet Take 6 tablets for 1 day, then 5 tablets for 1 day, then 4 tablets for 1 day, then 3 tablets for 1 day, then 2 tablets for 1 day, then 1 tablet for 1 day, then STOP (Patient not taking: Reported on 08/18/2023)   No facility-administered encounter medications on file as of 08/18/2023.    PAST MEDICAL HISTORY: Past Medical History:  Diagnosis Date   Brain lesion    pt reported single lesion when worked up for possible MS   Breast mass 05/04/2014   left   Complication of anesthesia    states had a problem with her heart rate during UHR, and was hard to wake up post-op; could not be more specific, but was not referred to cardiologist   Fibromyalgia    Migraine    Mood disorder (HCC)    Obesity    Tachycardia    states heart rate has been high for last 3 doctor visits, has not been referred to cardiolgist or endocrinologist   Trigeminal neuralgia     PAST SURGICAL HISTORY: Past Surgical History:   Procedure Laterality Date   BREAST EXCISIONAL BIOPSY Left    2015   BREAST LUMPECTOMY WITH RADIOACTIVE SEED LOCALIZATION Left 05/15/2014   Procedure: LEFT BREAST LUMPECTOMY WITH RADIOACTIVE SEED LOCALIZATION;  Surgeon: Boyce Byes, MD;  Location: [facility];  Service: General;  Laterality: Left;   CHOLECYSTECTOMY  08/01/2010   DILATION AND CURETTAGE OF UTERUS  01/06/2002   ELBOW FRACTURE SURGERY Right    age 20   NASAL TURBINATE REDUCTION     TONSILLECTOMY  1989   UMBILICAL HERNIA REPAIR  2010   UMBILICAL HERNIA REPAIR  2010    ALLERGIES: Allergies  Allergen Reactions   Adhesive [Tape] Other (See Comments)    BLISTERS   Hydrocodone Itching   Codeine Itching   Percocet [Oxycodone-Acetaminophen] Itching    FAMILY HISTORY: Family History  Problem Relation Age of Onset   Migraines Mother    Hypertension Mother    Hypertension Father    Hyperthyroidism Sister    Polycystic ovary syndrome Sister    Heart attack Maternal Grandfather    Heart attack Paternal Grandmother    Heart disease Paternal Grandfather    Migraines Son     SOCIAL HISTORY: Social History   Tobacco Use   Smoking status: Never   Smokeless tobacco: Never  Vaping Use   Vaping status: Never Used  Substance Use Topics   Alcohol use: Yes    Comment: rarely   Drug use: No   Social History   Social History Narrative   Lives at home with husband and 3 kids   Education -    Caffeine use: 1 cup daily   Are you right handed or left handed? right   Are you currently employed ? yes   What is your current occupation?   Do you live at home alone? no   Who lives with you? Husband and children   What type of home do you live in: 1 story or 2 story? one          Objective:  Vital Signs:  BP (!) 120/53   Pulse (!) 103   Ht 5\' 3"  (1.6 m)   Wt 229 lb (103.9 kg)   SpO2 98%   BMI 40.57 kg/m   General: No acute distress.  Patient appears well-groomed.   Head:   Normocephalic/atraumatic Eyes:  Fundi examined, disc margins clear, no obvious papilledema Neck: supple, positive for paraspinal tenderness, full range of motion Heart:  Regular rate and rhyth Lungs:  Non-labored breathing on room air  Neurological Exam: alert and oriented.  Speech fluent and not dysarthric, language intact.  CN II-XII intact. Bulk and tone normal, muscle strength 5/5 throughout.  Sensation to light touch intact.  Deep tendon reflexes 2+ throughout.  Finger to nose testing intact.  Gait normal.   Labs and Imaging review: No new results  Previously reviewed results: 11/19/22: CMP unremarkable CBC unremarkable   07/29/22: ESR wnl B12: 341         Lab Results  Component Value Date    HGBA1C 5.5 07/05/2018      Recent Labs           Lab Results  Component Value Date    VITAMINB12 486 07/05/2018      Recent Labs           Lab Results  Component Value Date    TSH 0.612 07/05/2018      Recent Labs[] Expand by Default  No results found for: "ESRSEDRATE", "POCTSEDRATE"      External labs: TSH - wnl   Imaging:  MRI Brain 09/08/2021 Small T2 FLAIR hyperintense remote insults within the genu of right  internal capsule and posterior right lentiform nucleus (measuring up  to 5 mm), unchanged from the prior brain MRI of 09/02/2019. Although  nonspecific, these could reflect small chronic infarcts.  Otherwise unremarkable MRI appearance of the brain.  Small mucous retention cysts within the right maxillary sinus.   MRI Lumbar 01/28/2020 Normal MRI of the lumbar spine. No findings to explain patient's  symptoms identified.   MRI Brain 09/02/2019 Single 3 mm T2/flair hyperintense focus in the right anterior lateral  thalamus, unchanged in appearance compared to the 2019 MRI. This could represent a small focus of gliosis from chronic ischemic change or other etiology.  There is a single enhancement pattern and no acute findings.   CTA Head and Neck  12/07/2018 Normal CT head  Normal CTA head and neck. Negative for stenosis or dissection. Negative for cerebral aneurysm.   MRI Brain 07/05/2018 Unremarkable MRI brain (with and without). No acute findings.  Stable small (3mm) focus of right thalamic gliosis. No change  from MRI on 02/13/18.   MRI C-spine 02/13/2018 Unremarkable MRI scan of cervical spine with and without  contrast   MRI brain wo contrast (11/19/22): FINDINGS: Brain: Small remote insult at the genu of the internal capsule and posterior putamen on the right. These remain nonspecific and isolated. No generalized white matter disease, acute infarct, hemorrhage, hydrocephalus, or mass. Brain volume is normal   Vascular: Normal flow voids.   Skull and upper cervical spine: Normal marrow signal   Sinuses/Orbits: Negative   IMPRESSION: Two small remote insults in the deep right cerebrum are stable since at least 2020 and remain nonspecific. No acute or progressive finding.   MRI cervical, thoracic, and lumbar spine wo contrast (11/19/22): FINDINGS: MRI CERVICAL SPINE FINDINGS   Alignment: Physiologic.   Vertebrae: No fracture, evidence of discitis, or bone lesion.   Cord: Normal signal and morphology.   Posterior Fossa, vertebral arteries, paraspinal tissues: Negative.   Disc levels:   No herniation, degenerative spurring, or impingement.   MRI THORACIC SPINE FINDINGS   Alignment:  No listhesis   Vertebrae: No fracture, evidence of discitis, or bone  lesion.   Cord:  Normal signal and morphology.   Paraspinal and other soft tissues: No perispinal mass or inflammation. Root sleeve cysts most notably at the left T1-2 and T2 foramina.   Disc levels:   Small disc protrusions at T4-5 to T8-9.  No neural impingement.   MRI LUMBAR SPINE FINDINGS   Segmentation:  Standard.   Alignment:  Physiologic.   Vertebrae:  No fracture, evidence of discitis, or bone lesion.   Conus medullaris and cauda equina:  Conus extends to the L1 level. Conus and cauda equina appear normal.   Paraspinal and other soft tissues: Negative for perispinal mass or inflammation   Disc levels:   Small left foraminal protrusion at L4-5.   IMPRESSION: Normal MRI of the cord.   Small midthoracic disc herniations. No neural impingement throughout the spine.  Assessment/Plan:  This is Debra Hensley, a 43 y.o. female with: 1. Migraine with aura - improved when she had Aimovig and Nurtec helps for rescue. Patient has tried multiple previous migraine preventative medications and abortive therapies and either had lack of efficacy or side effects. These include: Gabapentin Lyrica Keppra Verapamil Clonazepam Carbamazepine Lamictal Topamax    Indomethacin Rizatriptan Sumatriptan Naproxen Tylenol  She also has contributions from cervicalgia. She has completed PT for this.   2. Left face pain previously felt to be trigeminal neuralgia. She was on Trileptal but stopped it. This did not change her symptoms and they were improving with Aimovig.   3. Fibromyalgia - on Cymbalta 120 mg at bedtime  Plan: -B12 1000 mcg daily -Continue vit D supplement For migraines: Migraine prevention:  Aimovig 140 mg - PA good until 04/2024 Migraine rescue:  Nurtec 75 mg as needed Limit use of pain relievers to no more than 2 days out of week to prevent risk of rebound or medication-overuse headache. Keep headache diary  Return to clinic in 6 months   Rommie Coats, MD

## 2023-08-18 ENCOUNTER — Encounter: Payer: Self-pay | Admitting: Neurology

## 2023-08-18 ENCOUNTER — Ambulatory Visit: Payer: 59 | Admitting: Neurology

## 2023-08-18 VITALS — BP 120/53 | HR 103 | Ht 63.0 in | Wt 229.0 lb

## 2023-08-18 DIAGNOSIS — M542 Cervicalgia: Secondary | ICD-10-CM | POA: Diagnosis not present

## 2023-08-18 DIAGNOSIS — G43109 Migraine with aura, not intractable, without status migrainosus: Secondary | ICD-10-CM | POA: Diagnosis not present

## 2023-08-18 DIAGNOSIS — M797 Fibromyalgia: Secondary | ICD-10-CM

## 2023-08-18 DIAGNOSIS — R519 Headache, unspecified: Secondary | ICD-10-CM

## 2023-08-18 DIAGNOSIS — E538 Deficiency of other specified B group vitamins: Secondary | ICD-10-CM

## 2023-08-18 DIAGNOSIS — E559 Vitamin D deficiency, unspecified: Secondary | ICD-10-CM

## 2023-08-18 NOTE — Patient Instructions (Addendum)
-  B12 1000 mcg daily -Continue vit D supplement  For migraines: Migraine prevention:  Aimovig 140 mg  Migraine rescue:  Nurtec 75 mg as needed Limit use of pain relievers to no more than 2 days out of week to prevent risk of rebound or medication-overuse headache. Keep headache diary  Return to clinic in 6 months  The physicians and staff at [facility] are committed to providing excellent care. You may receive a survey requesting feedback about your experience at our office. We strive to receive "very good" responses to the survey questions. If you feel that your experience would prevent you from giving the office a "very good " response, please contact our office to try to remedy the situation. We may be reached at [phone]. Thank you for taking the time out of your busy day to complete the survey.  Rommie Coats, MD [facility]

## 2023-08-20 ENCOUNTER — Other Ambulatory Visit: Payer: Self-pay | Admitting: Neurology

## 2023-08-20 DIAGNOSIS — G43109 Migraine with aura, not intractable, without status migrainosus: Secondary | ICD-10-CM

## 2023-08-23 ENCOUNTER — Other Ambulatory Visit: Payer: Self-pay | Admitting: Neurology

## 2023-08-23 DIAGNOSIS — G43109 Migraine with aura, not intractable, without status migrainosus: Secondary | ICD-10-CM

## 2023-09-21 ENCOUNTER — Other Ambulatory Visit (HOSPITAL_COMMUNITY): Payer: Self-pay

## 2023-09-21 ENCOUNTER — Telehealth: Payer: Self-pay | Admitting: Pharmacy Technician

## 2023-09-21 NOTE — Telephone Encounter (Signed)
 Pharmacy Patient Advocate Encounter   Received notification from CoverMyMeds that prior authorization for NURTEC 75MG  is required/requested.   Insurance verification completed.   The patient is insured through [facility] .   Per test claim: PA required; PA submitted to above mentioned insurance via CoverMyMeds Key/confirmation #/EOC [id number] Status is pending

## 2023-09-26 ENCOUNTER — Other Ambulatory Visit: Payer: Self-pay | Admitting: Neurology

## 2023-09-26 DIAGNOSIS — G43109 Migraine with aura, not intractable, without status migrainosus: Secondary | ICD-10-CM

## 2023-10-05 NOTE — Telephone Encounter (Signed)
 Approved on May 20 by [employer] 2017 NCPDP Request Reference Number: [id number]. NURTEC TAB 75MG  ODT is approved through 09/20/2025. Your patient may now fill this prescription and it will be covered. Effective Date: 09/21/2023 Authorization Expiration Date: 09/20/2025

## 2023-10-25 ENCOUNTER — Other Ambulatory Visit: Payer: Self-pay | Admitting: Neurology

## 2023-10-25 DIAGNOSIS — G43109 Migraine with aura, not intractable, without status migrainosus: Secondary | ICD-10-CM

## 2024-02-04 NOTE — Progress Notes (Signed)
 NEUROLOGY FOLLOW UP OFFICE NOTE  Debra Hensley [id number]  Subjective:  Debra Hensley is a 43 y.o. year old right-handed female with a medical history of migraine with aura, fibromyalgia, anxiety, depression who we last saw on 08/18/23 for migraines.  To briefly review: 07/29/22: Patient has a long history of migraines since childhood and a strong family history of migraines. About 5 years ago, (?2017) she started having mostly left face pain (very rare on right). It feels like she has pain from left ear to jaw to nose then eye. It can be triggered by crying, brushing her teeth, chewing the wrong way, or stress. She describes a sharp stabbing pain that becomes constant for up to 3 days (does not quickly come and go). She has phonophobia, but not clear photophobia. She denies nausea or vomiting. It will hurt for her to talk. She has to stay in bed.   Rizatriptan  can help. It can make symptoms go away in about 30 minutes, but symptoms return within a few hours. She will take 9-10 rizatriptans in a 3 day period when her symptoms get bad. She will also take tylenol , 600 mg every 4-6 hours during her 3 day headaches.   For prevention, she is currently on Cymbalta 180 mg qhs (prescribed 120 mg) and trileptal 450 mg qhs (prescribed 150 mg in am and 450 mg qhs). She has tried many previous medications, see below. She has not previously tried botox, CGRP inhibitors, or gepants.    She will get a 3 day pain episode once per week or once every other week (2-4 episodes per month).   Patient has previously been seen by multiple neurologists. In 2017, Dr. Margaret at [facility] mentioned migraines with aura since the age of 30. Notes first mention left face pain associated with pain in eye, neck, and shoulder as well in 2019. MRI on 02/13/18 showed small right thalamic gliosis. Patient was diagnosed with thalamic pain syndrome.   Patient was then seen virtually at [facility] in 2021. Their impression was that patient  had an atypical form of trigeminal neuralgia as there was jabbing and stabbing pain in left V1-V3 distribution that constantly repeats and can last 3-4 days.   Patient was seen at [facility] in 2022 for essential tremor. Per 10/23/2021 note from Dr. Tawanna: Exam had findings of mild finger myoclonus and I suspect her jerking during sleep is nocturnal myoclonus. She also had a tremor of variable frequency with distractibility and entrainment suggestive of a functional component. We discontinued clonazepam as she had daytime sleepiness with no benefit to myoclonus. Since her last visit, the patient initiated Keppra 250 mg bid without improvement in myoclonus and with increased eczema flare-ups. For various reasons she self-discontinued Keppra and all other medications - with the exceptions of Trileptal and Cymbalta - with subsequent worsening of her trigeminal neuralgia which, when poorly controlled, is significantly more bothersome than her abnormal movements. She was seen by Dr. Gordy for her headaches around 2 weeks ago who recommended successive titration of Trileptal, verapamil, and tizanidine. Given that she is in the midst of adjusting several medications for headaches, we will defer trialing any medications for myoclonus at this time. We discussed Depakote for future consideration. I have counseled on CBT-based therapy for the functional component.   Patient was last seen by Dr. Dannielle at [facility] on 04/14/22. Per that clinic note: Patient states that about 3 and half years ago, she started having episodes of right-sided facial pain that would  originate behind the right ear and radiated up into the right orbit and cheek area. Pain would be so severe that she described it as a hot poker and sharp in nature. Typically these pain sensations would last about 3 days before they would resolve. The only thing that would alleviate the pain even remotely would be taking multiple doses of rizatriptan  (more than prescribed).  Triggers would include stress, being tired and brushing her teeth. Has undergone imaging studies that did not show any acute abnormalities that would be contributing to the pain. Initially was seen neurology who thought this was more of a migraine variant, but then later she was seen at [facility] where she was told this was more related to trigeminal neuralgia. Has not noted any clear of facial flushing, tearing or rhinitis when these occur. Sometimes feels like she will be drooling when the pain is at its worst. Also has been noticing other issues where she will have some tremors at times, losing her balance issues with walking and getting her words out. Dors is some tingling of both feet and hands, but more than anything, the pain in the face seems to be the most prominent issue for her today. Has tried numerous medications in the past and stated that a lot of these were discontinued on her own secondary to side effects can try and remain functional. At present, taking oxcarbazepine 450 mg at night, but was not able to tolerate this as much during the day secondary to sedation.  Previous medications tried Trileptal Cymbalta Gabapentin  Lyrica Indomethacin Rizatriptan  Sumatriptan  Keppra Verapamil Sumatriptan  hand Naproxen Clonazepam Carbamazepine  Lamictal  Topamax     Assessment  43 y.o. female with PMHx as above who presents for evaluation of trigeminal neuralgia. Patient's neurologic exam is nonfocal and nonlateralizing, which is reassuring. At this point, it is unclear how much of this is actually related to her trigeminal neuralgia versus an atypical headache disorder, such as hemicrania continua. Generally with trigeminal neuralgia at the symptoms are more persistent. And, with a headache disorders, they still can involve the trigeminal nerve. She is already tried multiple medications and had several different side effects. At this point, I do not think there is anything else additional that I would  be able to offer that has not already been tried. May benefit from seeing a dedicated headache provider or pain specialist. Injections might be another option.  Plan   Trigeminal neuralgia versus atypical headache disorder Already tried and failed numerous different medications May continue with the Cymbalta and Trileptal Referral to headache specialist Could also consider referral to pain specialist    Patient has a family, does not wish to have more children, and currently has an IUD in place.   She does not drink caffeine. She drinks EtOH only rarely. She does not smoke.   Patient also mentions various other symptoms: -She shakes all over all the time. -She has difficulty getting her words out. No clear pattern. -Memory difficulties -Scalp hurts to touch -Neck pain -Difficulty controlling bladder (no difficulty with bowels) -Tingling in arms and legs, no pattern or triggers, but comes and goes frequently - wonders if it is medication related   I ordered Aimovig  and Nurtec for patient on 07/29/22.   04/08/23: Nurtec and Aimovig  were approved (08/10/22 and 08/07/22 respectively). The Aimovig  refills were denied, so patient has not had since 02/2023. PA is currently pending. When she was taking Aimovig , she would have 2 headaches per month (previously 1 per week). Since the Aimovig   has not been refilled, she has more headaches. She is also going through divorce, so this is also contributing. She continues to take Nurtec, it will help for a while.   She is now having imbalance, vertigo when changing positions, and dropping things in her hands for the last 6 months. These seem unrelated to headaches. It is worse with stress. She can have tingling    ESR was normal. B12 was borderline low, for which I recommended B12 1000 mcg daily. She has not started this.   She presented to ED on 11/19/22 for paresthesias. MRI brain was stable since 2020 (two small remote insults in right cerebrum). MRI  cervical, thoracic, and lumbar spine were unremarkable.   Patient stopped trileptal 450 mg at bedtime. The face pain did not change when she stopped   Current medications: -Cymbalta 120 mg qhs -Aimovig  q28 days - not taken since 02/2023 - new PA needed -Nurtec PRN  08/18/23: Aimovig  was approved through 04/15/24 by insurance on 04/16/23. She is having maybe 1 headache per month. When she does get a headache, it is not as severe. Nurtec helps the headache within 30 minutes.    Patient still gets an occasional zapping left facial pain but this has also improved some with Aimovig .   Patient is taking B12 and vit D. She is still on Cymbalta for fibromyalgia.   Most recent Assessment and Plan (08/18/23): This is Debra Hensley, a 43 y.o. female with: 1. Migraine with aura - improved when she had Aimovig  and Nurtec helps for rescue. Patient has tried multiple previous migraine preventative medications and abortive therapies and either had lack of efficacy or side effects. These include: Gabapentin  Lyrica Keppra Verapamil Clonazepam Carbamazepine  Lamictal  Topamax     Indomethacin Rizatriptan  Sumatriptan  Naproxen Tylenol    She also has contributions from cervicalgia. She has completed PT for this.   2. Left face pain previously felt to be trigeminal neuralgia. She was on Trileptal but stopped it. This did not change her symptoms and they were improving with Aimovig .   3. Fibromyalgia - on Cymbalta 120 mg at bedtime   Plan: -B12 1000 mcg daily -Continue vit D supplement For migraines: Migraine prevention:  Aimovig  140 mg - PA good until 04/2024 Migraine rescue:  Nurtec 75 mg as needed Limit use of pain relievers to no more than 2 days out of week to prevent risk of rebound or medication-overuse headache. Keep headache diary  Since their last visit: Patient saw [facility] neurology on 11/10/23 and 12/27/23 seeking second opinion and concern for MS. They agreed that there was no  evidence of MS.  She has had 2 severe headaches over the last week, and a total 4 headaches in the last month. She will have pain in her face (can be left or right) that lasts multiple hours to over a day. She has additional jolts that last seconds. She is having significant sensitivity of her head where is hurts to touch. She thinks she has averaged a headache about once per week over the last month. She will take Nurtec and the symptoms will go away for a couple of hours, but symptoms can sometimes return.  She continues to take Aimovig  with no side effects.  MEDICATIONS:  Outpatient Encounter Medications as of 02/17/2024  Medication Sig   acetaminophen  (TYLENOL ) 500 MG tablet Take 500 mg by mouth every 6 (six) hours as needed.   brexpiprazole (REXULTI) 1 MG TABS tablet Take 0.25 mg by mouth daily.   cholecalciferol (  VITAMIN D3) 25 MCG (1000 UNIT) tablet Take 1,000 Units by mouth daily.   DULoxetine (CYMBALTA) 60 MG capsule Take 120 mg by mouth daily.   Erenumab -aooe (AIMOVIG ) 140 MG/ML SOAJ INJECT 140MG  INTO THE SKIN EVERY 28 DAYS   Rimegepant Sulfate (NURTEC) 75 MG TBDP DISSOLVE ONE TABLET BY MOUTH AS NEEDED (FOR FIRST ON SET OF MIGRAINE ONLY ONE TIME WITHIN 24 HOURS)   methocarbamol  (ROBAXIN ) 500 MG tablet Take 1 tablet (500 mg total) by mouth every 8 (eight) hours as needed for muscle spasms. (Patient not taking: Reported on 02/17/2024)   methylPREDNISolone  (MEDROL  DOSEPAK) 4 MG TBPK tablet Take 6 tablets for 1 day, then 5 tablets for 1 day, then 4 tablets for 1 day, then 3 tablets for 1 day, then 2 tablets for 1 day, then 1 tablet for 1 day, then STOP (Patient not taking: Reported on 02/17/2024)   No facility-administered encounter medications on file as of 02/17/2024.    PAST MEDICAL HISTORY: Past Medical History:  Diagnosis Date   Brain lesion    pt reported single lesion when worked up for possible MS   Breast mass 05/04/2014   left   Complication of anesthesia    states had a  problem with her heart rate during UHR, and was hard to wake up post-op; could not be more specific, but was not referred to cardiologist   Fibromyalgia    Migraine    Mood disorder    Obesity    Tachycardia    states heart rate has been high for last 3 doctor visits, has not been referred to cardiolgist or endocrinologist   Trigeminal neuralgia     PAST SURGICAL HISTORY: Past Surgical History:  Procedure Laterality Date   BREAST EXCISIONAL BIOPSY Left    2015   BREAST LUMPECTOMY WITH RADIOACTIVE SEED LOCALIZATION Left 05/15/2014   Procedure: LEFT BREAST LUMPECTOMY WITH RADIOACTIVE SEED LOCALIZATION;  Surgeon: Elon Pacini, MD;  Location: [facility];  Service: General;  Laterality: Left;   CHOLECYSTECTOMY  08/01/2010   DILATION AND CURETTAGE OF UTERUS  01/06/2002   ELBOW FRACTURE SURGERY Right    age 6   NASAL TURBINATE REDUCTION     TONSILLECTOMY  1989   UMBILICAL HERNIA REPAIR  2010   UMBILICAL HERNIA REPAIR  2010    ALLERGIES: Allergies  Allergen Reactions   Adhesive [Tape] Other (See Comments)    BLISTERS   Hydrocodone  Itching   Codeine Itching   Percocet [Oxycodone -Acetaminophen ] Itching    FAMILY HISTORY: Family History  Problem Relation Age of Onset   Migraines Mother    Hypertension Mother    Hypertension Father    Hyperthyroidism Sister    Polycystic ovary syndrome Sister    Heart attack Maternal Grandfather    Heart attack Paternal Grandmother    Heart disease Paternal Grandfather    Migraines Son     SOCIAL HISTORY: Social History   Tobacco Use   Smoking status: Never   Smokeless tobacco: Never  Vaping Use   Vaping status: Never Used  Substance Use Topics   Alcohol use: Yes    Comment: rarely   Drug use: No   Social History   Social History Narrative   Lives at home with husband and 3 kids   Education -    Caffeine use: 1 cup daily   Are you right handed or left handed? right   Are you currently employed ? yes   What is  your current occupation?  Do you live at home alone? no   Who lives with you? Husband and children   What type of home do you live in: 1 story or 2 story? one          Objective:  Vital Signs:  BP 133/83   Pulse 97   Ht 5' 3 (1.6 m)   Wt 237 lb (107.5 kg)   SpO2 96%   BMI 41.98 kg/m   General: No acute distress.  Patient appears well-groomed.   Head:  Normocephalic/atraumatic Eyes:  Fundi examined. Disc margins clear, no obvious papilledema Neck: supple Heart:  Regular rate and rhythm Lungs:  Clear to auscultation bilaterally Back: No paraspinal tenderness Neurological Exam: alert and oriented.  Speech fluent and not dysarthric, language intact.  CN II-XII intact. Bulk and tone normal, muscle strength 5/5 throughout.  Sensation to light touch intact.  Deep tendon reflexes 2+ throughout.  Finger to nose testing intact.  Gait normal.   Labs and Imaging review: New results: 11/19/23: CMP unremarkable CBC unremarkable Chem 8 significant for mildly low ionized Ca (1.11)  Previously reviewed results: 07/29/22: ESR wnl B12: 341             Lab Results  Component Value Date    HGBA1C 5.5 07/05/2018      Recent Labs           Lab Results  Component Value Date    VITAMINB12 486 07/05/2018      Recent Labs           Lab Results  Component Value Date    TSH 0.612 07/05/2018      External labs: TSH - wnl   Imaging:  MRI Brain 09/08/2021 Small T2 FLAIR hyperintense remote insults within the genu of right  internal capsule and posterior right lentiform nucleus (measuring up  to 5 mm), unchanged from the prior brain MRI of 09/02/2019. Although  nonspecific, these could reflect small chronic infarcts.  Otherwise unremarkable MRI appearance of the brain.  Small mucous retention cysts within the right maxillary sinus.   MRI Lumbar 01/28/2020 Normal MRI of the lumbar spine. No findings to explain patient's  symptoms identified.   MRI Brain 09/02/2019 Single 3 mm  T2/flair hyperintense focus in the right anterior lateral  thalamus, unchanged in appearance compared to the 2019 MRI. This could represent a small focus of gliosis from chronic ischemic change or other etiology.  There is a single enhancement pattern and no acute findings.   CTA Head and Neck 12/07/2018 Normal CT head  Normal CTA head and neck. Negative for stenosis or dissection. Negative for cerebral aneurysm.   MRI Brain 07/05/2018 Unremarkable MRI brain (with and without). No acute findings.  Stable small (3mm) focus of right thalamic gliosis. No change  from MRI on 02/13/18.   MRI C-spine 02/13/2018 Unremarkable MRI scan of cervical spine with and without  contrast    MRI brain wo contrast (11/19/22): FINDINGS: Brain: Small remote insult at the genu of the internal capsule and posterior putamen on the right. These remain nonspecific and isolated. No generalized white matter disease, acute infarct, hemorrhage, hydrocephalus, or mass. Brain volume is normal   Vascular: Normal flow voids.   Skull and upper cervical spine: Normal marrow signal   Sinuses/Orbits: Negative   IMPRESSION: Two small remote insults in the deep right cerebrum are stable since at least 2020 and remain nonspecific. No acute or progressive finding.   MRI cervical, thoracic, and lumbar spine wo contrast (11/19/22):  FINDINGS: MRI CERVICAL SPINE FINDINGS   Alignment: Physiologic.   Vertebrae: No fracture, evidence of discitis, or bone lesion.   Cord: Normal signal and morphology.   Posterior Fossa, vertebral arteries, paraspinal tissues: Negative.   Disc levels:   No herniation, degenerative spurring, or impingement.   MRI THORACIC SPINE FINDINGS   Alignment:  No listhesis   Vertebrae: No fracture, evidence of discitis, or bone lesion.   Cord:  Normal signal and morphology.   Paraspinal and other soft tissues: No perispinal mass or inflammation. Root sleeve cysts most notably at the left  T1-2 and T2 foramina.   Disc levels:   Small disc protrusions at T4-5 to T8-9.  No neural impingement.   MRI LUMBAR SPINE FINDINGS   Segmentation:  Standard.   Alignment:  Physiologic.   Vertebrae:  No fracture, evidence of discitis, or bone lesion.   Conus medullaris and cauda equina: Conus extends to the L1 level. Conus and cauda equina appear normal.   Paraspinal and other soft tissues: Negative for perispinal mass or inflammation   Disc levels:   Small left foraminal protrusion at L4-5.   IMPRESSION: Normal MRI of the cord.   Small midthoracic disc herniations. No neural impingement throughout the spine.  Assessment/Plan:  This is Debra Hensley, a 43 y.o. female with: Migraine with aura - improved when she had Aimovig  and Nurtec helps for rescue. Patient has tried multiple previous migraine preventative medications and abortive therapies and either had lack of efficacy or side effects. These include:  Gabapentin  Lyrica Keppra Verapamil Clonazepam Carbamazepine  Lamictal  Topamax     Indomethacin Rizatriptan  Sumatriptan  Naproxen Tylenol    She also has contributions from cervicalgia. She has completed PT for this.  2. Left face pain previously felt to be trigeminal neuralgia. She was on Trileptal but stopped it. This did not change her symptoms and they were improving with Aimovig . She still has jolt like sensations that last seconds on top of her migraine, so overlapping TN is possible.   3. Fibromyalgia - on Cymbalta 120 mg at bedtime  Plan: -Continue B12 1000 mcg daily -Continue Vit D -Medrol  dose pack to break current headache cycle -MRI trigeminal nerve protocol -For migraines: Migraine prevention:  Continue Aimovig  140 mg every 28 days Migraine rescue:  Continue Nurtec 75 mg as needed at headache onset Limit use of pain relievers to no more than 2 days out of week to prevent risk of rebound or medication-overuse headache. Keep headache  diary   Return to clinic in 6 months  Total time spent reviewing records, interview, history/exam, documentation, and coordination of care on day of encounter:  30 min  Venetia Potters, MD

## 2024-02-17 ENCOUNTER — Encounter: Payer: Self-pay | Admitting: Neurology

## 2024-02-17 ENCOUNTER — Ambulatory Visit: Admitting: Neurology

## 2024-02-17 VITALS — BP 133/83 | HR 97 | Ht 63.0 in | Wt 237.0 lb

## 2024-02-17 DIAGNOSIS — E559 Vitamin D deficiency, unspecified: Secondary | ICD-10-CM | POA: Diagnosis not present

## 2024-02-17 DIAGNOSIS — M797 Fibromyalgia: Secondary | ICD-10-CM | POA: Diagnosis not present

## 2024-02-17 DIAGNOSIS — E538 Deficiency of other specified B group vitamins: Secondary | ICD-10-CM

## 2024-02-17 DIAGNOSIS — G43109 Migraine with aura, not intractable, without status migrainosus: Secondary | ICD-10-CM | POA: Diagnosis not present

## 2024-02-17 DIAGNOSIS — M542 Cervicalgia: Secondary | ICD-10-CM

## 2024-02-17 DIAGNOSIS — R519 Headache, unspecified: Secondary | ICD-10-CM

## 2024-02-17 MED ORDER — METHYLPREDNISOLONE 4 MG PO TBPK
ORAL_TABLET | ORAL | 0 refills | Status: AC
Start: 2024-02-17 — End: ?

## 2024-02-17 NOTE — Patient Instructions (Signed)
-  For migraines: Migraine prevention:  Continue Aimovig  140 mg every 28 days Migraine rescue:  Continue Nurtec 75 mg as needed at headache onset Limit use of pain relievers to no more than 2 days out of week to prevent risk of rebound or medication-overuse headache. Keep headache diary  I am also prescribing a short burst of steroids to attempt to break recent headache cycle.  We will do an MRI looking at the trigeminal nerve to determine if there is anything compressing the nerve that could be causing the electric shock sensations on your face.  I will be in touch when I have those results.  I will see you in clinic again in about 6 months.  Please let me know if you have any questions or concerns in the meantime.  The physicians and staff at [facility] are committed to providing excellent care. You may receive a survey requesting feedback about your experience at our office. We strive to receive very good responses to the survey questions. If you feel that your experience would prevent you from giving the office a very good  response, please contact our office to try to remedy the situation. We may be reached at [phone]. Thank you for taking the time out of your busy day to complete the survey.  Venetia Potters, MD [facility]

## 2024-03-06 ENCOUNTER — Other Ambulatory Visit: Payer: Self-pay | Admitting: Neurology

## 2024-03-06 DIAGNOSIS — G43109 Migraine with aura, not intractable, without status migrainosus: Secondary | ICD-10-CM

## 2024-03-18 ENCOUNTER — Other Ambulatory Visit

## 2024-04-03 ENCOUNTER — Other Ambulatory Visit: Payer: Self-pay | Admitting: Neurology

## 2024-04-03 DIAGNOSIS — G43109 Migraine with aura, not intractable, without status migrainosus: Secondary | ICD-10-CM

## 2024-04-30 ENCOUNTER — Other Ambulatory Visit: Payer: Self-pay | Admitting: Neurology

## 2024-04-30 DIAGNOSIS — G43109 Migraine with aura, not intractable, without status migrainosus: Secondary | ICD-10-CM

## 2024-05-01 ENCOUNTER — Telehealth: Payer: Self-pay

## 2024-05-01 ENCOUNTER — Telehealth: Payer: Self-pay | Admitting: Pharmacy Technician

## 2024-05-01 ENCOUNTER — Other Ambulatory Visit (HOSPITAL_COMMUNITY): Payer: Self-pay

## 2024-05-01 MED ORDER — AIMOVIG 140 MG/ML ~~LOC~~ SOAJ: 140.0000 mg | SUBCUTANEOUS | 11 refills | Status: AC

## 2024-05-01 NOTE — Telephone Encounter (Signed)
 Pharmacy Patient Advocate Encounter   Received notification from Pt Calls Messages that prior authorization for AIMOVIG  140MG  is required/requested.   Insurance verification completed.   The patient is insured through [facility].   Per test claim: PA required; PA submitted to above mentioned insurance via Latent Key/confirmation #/EOC [id number] Status is pending

## 2024-05-01 NOTE — Addendum Note (Signed)
 Addended by: LEIGH VENETIA CROME on: 05/01/2024 08:25 AM   Modules accepted: Orders

## 2024-05-01 NOTE — Telephone Encounter (Signed)
 PA has been submitted, and telephone encounter has been created. Please see telephone encounter dated 12.29.25.

## 2024-05-31 ENCOUNTER — Telehealth: Payer: Self-pay

## 2024-05-31 ENCOUNTER — Telehealth: Payer: Self-pay | Admitting: Neurology

## 2024-05-31 NOTE — Telephone Encounter (Signed)
 Pt called stating she needs to know what's going on with her Aimovig . The pharmacy is saying the insurance is denying it, the insurance is stating they dont have an RX. I do see where a PA was needed in 04/2024. She is starting to get a migraine. Can she get samples? Can she get a migraine cocktail?

## 2024-06-01 ENCOUNTER — Telehealth: Payer: Self-pay

## 2024-06-01 ENCOUNTER — Other Ambulatory Visit (INDEPENDENT_AMBULATORY_CARE_PROVIDER_SITE_OTHER)

## 2024-06-01 ENCOUNTER — Ambulatory Visit (INDEPENDENT_AMBULATORY_CARE_PROVIDER_SITE_OTHER)

## 2024-06-01 DIAGNOSIS — G43109 Migraine with aura, not intractable, without status migrainosus: Secondary | ICD-10-CM

## 2024-06-01 MED ORDER — KETOROLAC TROMETHAMINE 60 MG/2ML IM SOLN
60.0000 mg | Freq: Once | INTRAMUSCULAR | Status: AC
  Administered 2024-06-01: 60 mg via INTRAMUSCULAR

## 2024-06-01 MED ORDER — METOCLOPRAMIDE HCL 5 MG/ML IJ SOLN
10.0000 mg | Freq: Once | INTRAMUSCULAR | Status: AC
  Administered 2024-06-01: 10 mg via INTRAMUSCULAR

## 2024-06-01 MED ORDER — DIPHENHYDRAMINE HCL 50 MG/ML IJ SOLN
50.0000 mg | Freq: Once | INTRAMUSCULAR | Status: AC
  Administered 2024-06-01: 25 mg via INTRAMUSCULAR

## 2024-06-01 NOTE — Telephone Encounter (Signed)
 Called pt and left message on voice mail that when she come for a cocktail a driver would need to be with her and come to the waiting room also.

## 2024-06-02 ENCOUNTER — Other Ambulatory Visit: Payer: Self-pay

## 2024-06-06 ENCOUNTER — Other Ambulatory Visit (HOSPITAL_COMMUNITY): Payer: Self-pay

## 2024-06-06 ENCOUNTER — Telehealth: Payer: Self-pay | Admitting: Pharmacy Technician

## 2024-06-06 DIAGNOSIS — G43109 Migraine with aura, not intractable, without status migrainosus: Secondary | ICD-10-CM | POA: Diagnosis not present

## 2024-06-06 MED ORDER — METOCLOPRAMIDE HCL 5 MG/ML IJ SOLN
10.0000 mg | Freq: Once | INTRAMUSCULAR | Status: AC
  Administered 2024-06-06: 10 mg via INTRAMUSCULAR

## 2024-06-06 MED ORDER — KETOROLAC TROMETHAMINE 60 MG/2ML IM SOLN
60.0000 mg | Freq: Once | INTRAMUSCULAR | Status: AC
  Administered 2024-06-06: 60 mg via INTRAMUSCULAR

## 2024-06-06 MED ORDER — DIPHENHYDRAMINE HCL 50 MG/ML IJ SOLN
50.0000 mg | Freq: Once | INTRAMUSCULAR | Status: AC
  Administered 2024-06-06: 25 mg via INTRAMUSCULAR

## 2024-06-06 NOTE — Telephone Encounter (Signed)
 Pharmacy Patient Advocate Encounter   Received notification from Pt Calls Messages that prior authorization for AIMOVIG  140MG  is required/requested.   Insurance verification completed.   The patient is insured through [facility].   Per test claim: PA required; PA submitted to above mentioned insurance via Latent Key/confirmation #/EOC [id number] Status is pending

## 2024-06-06 NOTE — Telephone Encounter (Signed)
 PA has been submitted, and telephone encounter has been created. Please see telephone encounter dated 2.3.26.

## 2024-06-07 ENCOUNTER — Other Ambulatory Visit: Payer: Self-pay | Admitting: Neurology

## 2024-06-07 DIAGNOSIS — G43109 Migraine with aura, not intractable, without status migrainosus: Secondary | ICD-10-CM

## 2024-08-17 ENCOUNTER — Ambulatory Visit: Admitting: Neurology
